# Patient Record
Sex: Female | Born: 1935 | Race: White | Hispanic: No | Marital: Single
Health system: Southern US, Community
[De-identification: ages and names within clinical notes are randomized; demographics above are authoritative.]

## PROBLEM LIST (undated history)

## (undated) DIAGNOSIS — F0390 Unspecified dementia without behavioral disturbance: Secondary | ICD-10-CM

---

## 2010-08-10 ENCOUNTER — Encounter: Admission: RE | Admit: 2010-08-10 | Discharge: 2010-08-10 | Payer: Self-pay | Admitting: Internal Medicine

## 2011-11-09 ENCOUNTER — Other Ambulatory Visit: Payer: Self-pay | Admitting: Internal Medicine

## 2011-11-09 DIAGNOSIS — Z1231 Encounter for screening mammogram for malignant neoplasm of breast: Secondary | ICD-10-CM

## 2011-11-18 DIAGNOSIS — M810 Age-related osteoporosis without current pathological fracture: Secondary | ICD-10-CM | POA: Diagnosis not present

## 2011-11-18 DIAGNOSIS — F3289 Other specified depressive episodes: Secondary | ICD-10-CM | POA: Diagnosis not present

## 2011-11-18 DIAGNOSIS — I10 Essential (primary) hypertension: Secondary | ICD-10-CM | POA: Diagnosis not present

## 2011-11-18 DIAGNOSIS — N182 Chronic kidney disease, stage 2 (mild): Secondary | ICD-10-CM | POA: Diagnosis not present

## 2011-11-18 DIAGNOSIS — H6120 Impacted cerumen, unspecified ear: Secondary | ICD-10-CM | POA: Diagnosis not present

## 2011-11-18 DIAGNOSIS — Z0000 Encounter for general adult medical examination without abnormal findings: Secondary | ICD-10-CM | POA: Diagnosis not present

## 2011-11-18 DIAGNOSIS — F329 Major depressive disorder, single episode, unspecified: Secondary | ICD-10-CM | POA: Diagnosis not present

## 2011-11-18 DIAGNOSIS — F411 Generalized anxiety disorder: Secondary | ICD-10-CM | POA: Diagnosis not present

## 2011-11-29 DIAGNOSIS — H9190 Unspecified hearing loss, unspecified ear: Secondary | ICD-10-CM | POA: Diagnosis not present

## 2011-11-29 DIAGNOSIS — E785 Hyperlipidemia, unspecified: Secondary | ICD-10-CM | POA: Diagnosis not present

## 2011-11-29 DIAGNOSIS — E559 Vitamin D deficiency, unspecified: Secondary | ICD-10-CM | POA: Diagnosis not present

## 2011-11-29 DIAGNOSIS — I10 Essential (primary) hypertension: Secondary | ICD-10-CM | POA: Diagnosis not present

## 2011-11-29 DIAGNOSIS — F329 Major depressive disorder, single episode, unspecified: Secondary | ICD-10-CM | POA: Diagnosis not present

## 2011-11-29 DIAGNOSIS — R413 Other amnesia: Secondary | ICD-10-CM | POA: Diagnosis not present

## 2011-11-30 ENCOUNTER — Ambulatory Visit: Payer: Self-pay

## 2011-12-08 DIAGNOSIS — H903 Sensorineural hearing loss, bilateral: Secondary | ICD-10-CM | POA: Diagnosis not present

## 2012-02-28 DIAGNOSIS — H9190 Unspecified hearing loss, unspecified ear: Secondary | ICD-10-CM | POA: Diagnosis not present

## 2012-02-28 DIAGNOSIS — E559 Vitamin D deficiency, unspecified: Secondary | ICD-10-CM | POA: Diagnosis not present

## 2012-02-28 DIAGNOSIS — I10 Essential (primary) hypertension: Secondary | ICD-10-CM | POA: Diagnosis not present

## 2012-02-28 DIAGNOSIS — E785 Hyperlipidemia, unspecified: Secondary | ICD-10-CM | POA: Diagnosis not present

## 2012-02-28 DIAGNOSIS — R413 Other amnesia: Secondary | ICD-10-CM | POA: Diagnosis not present

## 2012-02-28 DIAGNOSIS — M810 Age-related osteoporosis without current pathological fracture: Secondary | ICD-10-CM | POA: Diagnosis not present

## 2012-02-28 DIAGNOSIS — Z1331 Encounter for screening for depression: Secondary | ICD-10-CM | POA: Diagnosis not present

## 2012-05-01 DIAGNOSIS — J209 Acute bronchitis, unspecified: Secondary | ICD-10-CM | POA: Diagnosis not present

## 2012-07-26 DIAGNOSIS — I10 Essential (primary) hypertension: Secondary | ICD-10-CM | POA: Diagnosis not present

## 2012-07-26 DIAGNOSIS — N184 Chronic kidney disease, stage 4 (severe): Secondary | ICD-10-CM | POA: Diagnosis not present

## 2012-07-26 DIAGNOSIS — K219 Gastro-esophageal reflux disease without esophagitis: Secondary | ICD-10-CM | POA: Diagnosis not present

## 2012-07-26 DIAGNOSIS — D649 Anemia, unspecified: Secondary | ICD-10-CM | POA: Diagnosis not present

## 2012-07-26 DIAGNOSIS — F329 Major depressive disorder, single episode, unspecified: Secondary | ICD-10-CM | POA: Diagnosis not present

## 2012-07-26 DIAGNOSIS — E785 Hyperlipidemia, unspecified: Secondary | ICD-10-CM | POA: Diagnosis not present

## 2012-07-26 DIAGNOSIS — R238 Other skin changes: Secondary | ICD-10-CM | POA: Diagnosis not present

## 2012-08-28 DIAGNOSIS — Z124 Encounter for screening for malignant neoplasm of cervix: Secondary | ICD-10-CM | POA: Insufficient documentation

## 2012-08-28 DIAGNOSIS — Z1151 Encounter for screening for human papillomavirus (HPV): Secondary | ICD-10-CM | POA: Insufficient documentation

## 2012-08-29 DIAGNOSIS — Z23 Encounter for immunization: Secondary | ICD-10-CM | POA: Diagnosis not present

## 2012-08-29 DIAGNOSIS — I10 Essential (primary) hypertension: Secondary | ICD-10-CM | POA: Diagnosis not present

## 2012-08-29 DIAGNOSIS — Z0000 Encounter for general adult medical examination without abnormal findings: Secondary | ICD-10-CM | POA: Diagnosis not present

## 2012-08-29 DIAGNOSIS — Z1331 Encounter for screening for depression: Secondary | ICD-10-CM | POA: Diagnosis not present

## 2012-08-29 DIAGNOSIS — N898 Other specified noninflammatory disorders of vagina: Secondary | ICD-10-CM | POA: Diagnosis not present

## 2012-08-29 DIAGNOSIS — F0280 Dementia in other diseases classified elsewhere without behavioral disturbance: Secondary | ICD-10-CM | POA: Diagnosis not present

## 2012-08-29 DIAGNOSIS — E785 Hyperlipidemia, unspecified: Secondary | ICD-10-CM | POA: Diagnosis not present

## 2012-08-29 DIAGNOSIS — M810 Age-related osteoporosis without current pathological fracture: Secondary | ICD-10-CM | POA: Diagnosis not present

## 2012-08-29 DIAGNOSIS — F329 Major depressive disorder, single episode, unspecified: Secondary | ICD-10-CM | POA: Diagnosis not present

## 2012-09-05 ENCOUNTER — Other Ambulatory Visit (HOSPITAL_COMMUNITY)
Admission: RE | Admit: 2012-09-05 | Discharge: 2012-09-05 | Disposition: A | Discharge status: Home / Self Care | Payer: Medicare Other | Source: Ambulatory Visit | Attending: Internal Medicine | Admitting: Internal Medicine

## 2012-11-23 DIAGNOSIS — E785 Hyperlipidemia, unspecified: Secondary | ICD-10-CM | POA: Diagnosis not present

## 2012-11-23 DIAGNOSIS — F329 Major depressive disorder, single episode, unspecified: Secondary | ICD-10-CM | POA: Diagnosis not present

## 2012-11-23 DIAGNOSIS — G309 Alzheimer's disease, unspecified: Secondary | ICD-10-CM | POA: Diagnosis not present

## 2013-01-24 DIAGNOSIS — F05 Delirium due to known physiological condition: Secondary | ICD-10-CM | POA: Diagnosis not present

## 2013-01-24 DIAGNOSIS — R82998 Other abnormal findings in urine: Secondary | ICD-10-CM | POA: Diagnosis not present

## 2013-02-20 DIAGNOSIS — I10 Essential (primary) hypertension: Secondary | ICD-10-CM | POA: Diagnosis not present

## 2013-02-20 DIAGNOSIS — F0280 Dementia in other diseases classified elsewhere without behavioral disturbance: Secondary | ICD-10-CM | POA: Diagnosis not present

## 2013-02-20 DIAGNOSIS — G309 Alzheimer's disease, unspecified: Secondary | ICD-10-CM | POA: Diagnosis not present

## 2013-02-20 DIAGNOSIS — F329 Major depressive disorder, single episode, unspecified: Secondary | ICD-10-CM | POA: Diagnosis not present

## 2013-03-18 DIAGNOSIS — Z79899 Other long term (current) drug therapy: Secondary | ICD-10-CM | POA: Diagnosis not present

## 2013-03-18 DIAGNOSIS — G309 Alzheimer's disease, unspecified: Secondary | ICD-10-CM | POA: Diagnosis not present

## 2013-03-18 DIAGNOSIS — E785 Hyperlipidemia, unspecified: Secondary | ICD-10-CM | POA: Diagnosis not present

## 2013-03-18 DIAGNOSIS — R634 Abnormal weight loss: Secondary | ICD-10-CM | POA: Diagnosis not present

## 2013-03-18 DIAGNOSIS — I10 Essential (primary) hypertension: Secondary | ICD-10-CM | POA: Diagnosis not present

## 2013-03-18 DIAGNOSIS — F0280 Dementia in other diseases classified elsewhere without behavioral disturbance: Secondary | ICD-10-CM | POA: Diagnosis not present

## 2013-04-03 DIAGNOSIS — I10 Essential (primary) hypertension: Secondary | ICD-10-CM | POA: Diagnosis not present

## 2013-04-03 DIAGNOSIS — F0280 Dementia in other diseases classified elsewhere without behavioral disturbance: Secondary | ICD-10-CM | POA: Diagnosis not present

## 2013-04-03 DIAGNOSIS — L989 Disorder of the skin and subcutaneous tissue, unspecified: Secondary | ICD-10-CM | POA: Diagnosis not present

## 2013-04-22 DIAGNOSIS — G309 Alzheimer's disease, unspecified: Secondary | ICD-10-CM | POA: Diagnosis not present

## 2013-04-22 DIAGNOSIS — M25559 Pain in unspecified hip: Secondary | ICD-10-CM | POA: Diagnosis not present

## 2013-04-22 DIAGNOSIS — G479 Sleep disorder, unspecified: Secondary | ICD-10-CM | POA: Diagnosis not present

## 2013-04-22 DIAGNOSIS — F0280 Dementia in other diseases classified elsewhere without behavioral disturbance: Secondary | ICD-10-CM | POA: Diagnosis not present

## 2013-04-22 DIAGNOSIS — I10 Essential (primary) hypertension: Secondary | ICD-10-CM | POA: Diagnosis not present

## 2013-06-11 DIAGNOSIS — Z111 Encounter for screening for respiratory tuberculosis: Secondary | ICD-10-CM | POA: Diagnosis not present

## 2013-07-10 DIAGNOSIS — F0280 Dementia in other diseases classified elsewhere without behavioral disturbance: Secondary | ICD-10-CM | POA: Diagnosis not present

## 2013-07-10 DIAGNOSIS — R635 Abnormal weight gain: Secondary | ICD-10-CM | POA: Diagnosis not present

## 2013-07-10 DIAGNOSIS — I10 Essential (primary) hypertension: Secondary | ICD-10-CM | POA: Diagnosis not present

## 2013-07-10 DIAGNOSIS — E785 Hyperlipidemia, unspecified: Secondary | ICD-10-CM | POA: Diagnosis not present

## 2013-07-30 DIAGNOSIS — I10 Essential (primary) hypertension: Secondary | ICD-10-CM | POA: Diagnosis not present

## 2013-07-30 DIAGNOSIS — F0280 Dementia in other diseases classified elsewhere without behavioral disturbance: Secondary | ICD-10-CM | POA: Diagnosis not present

## 2013-08-21 DIAGNOSIS — Z23 Encounter for immunization: Secondary | ICD-10-CM | POA: Diagnosis not present

## 2013-09-02 DIAGNOSIS — M25559 Pain in unspecified hip: Secondary | ICD-10-CM | POA: Diagnosis not present

## 2013-09-06 DIAGNOSIS — I10 Essential (primary) hypertension: Secondary | ICD-10-CM | POA: Diagnosis not present

## 2013-09-06 DIAGNOSIS — Z79899 Other long term (current) drug therapy: Secondary | ICD-10-CM | POA: Diagnosis not present

## 2013-09-06 DIAGNOSIS — E785 Hyperlipidemia, unspecified: Secondary | ICD-10-CM | POA: Diagnosis not present

## 2013-09-06 DIAGNOSIS — Z0000 Encounter for general adult medical examination without abnormal findings: Secondary | ICD-10-CM | POA: Diagnosis not present

## 2013-09-06 DIAGNOSIS — Z1331 Encounter for screening for depression: Secondary | ICD-10-CM | POA: Diagnosis not present

## 2013-11-08 DIAGNOSIS — M6281 Muscle weakness (generalized): Secondary | ICD-10-CM | POA: Diagnosis not present

## 2013-11-08 DIAGNOSIS — R279 Unspecified lack of coordination: Secondary | ICD-10-CM | POA: Diagnosis not present

## 2013-11-08 DIAGNOSIS — R269 Unspecified abnormalities of gait and mobility: Secondary | ICD-10-CM | POA: Diagnosis not present

## 2013-11-11 DIAGNOSIS — M6281 Muscle weakness (generalized): Secondary | ICD-10-CM | POA: Diagnosis not present

## 2013-11-11 DIAGNOSIS — R269 Unspecified abnormalities of gait and mobility: Secondary | ICD-10-CM | POA: Diagnosis not present

## 2013-11-11 DIAGNOSIS — R279 Unspecified lack of coordination: Secondary | ICD-10-CM | POA: Diagnosis not present

## 2013-11-12 DIAGNOSIS — R279 Unspecified lack of coordination: Secondary | ICD-10-CM | POA: Diagnosis not present

## 2013-11-12 DIAGNOSIS — M6281 Muscle weakness (generalized): Secondary | ICD-10-CM | POA: Diagnosis not present

## 2013-11-12 DIAGNOSIS — R269 Unspecified abnormalities of gait and mobility: Secondary | ICD-10-CM | POA: Diagnosis not present

## 2013-11-18 DIAGNOSIS — R488 Other symbolic dysfunctions: Secondary | ICD-10-CM | POA: Diagnosis not present

## 2013-11-18 DIAGNOSIS — M6281 Muscle weakness (generalized): Secondary | ICD-10-CM | POA: Diagnosis not present

## 2013-11-18 DIAGNOSIS — R269 Unspecified abnormalities of gait and mobility: Secondary | ICD-10-CM | POA: Diagnosis not present

## 2013-11-18 DIAGNOSIS — R41841 Cognitive communication deficit: Secondary | ICD-10-CM | POA: Diagnosis not present

## 2013-11-18 DIAGNOSIS — R279 Unspecified lack of coordination: Secondary | ICD-10-CM | POA: Diagnosis not present

## 2013-11-19 DIAGNOSIS — R269 Unspecified abnormalities of gait and mobility: Secondary | ICD-10-CM | POA: Diagnosis not present

## 2013-11-19 DIAGNOSIS — R41841 Cognitive communication deficit: Secondary | ICD-10-CM | POA: Diagnosis not present

## 2013-11-19 DIAGNOSIS — R279 Unspecified lack of coordination: Secondary | ICD-10-CM | POA: Diagnosis not present

## 2013-11-19 DIAGNOSIS — M6281 Muscle weakness (generalized): Secondary | ICD-10-CM | POA: Diagnosis not present

## 2013-11-19 DIAGNOSIS — R488 Other symbolic dysfunctions: Secondary | ICD-10-CM | POA: Diagnosis not present

## 2013-11-20 DIAGNOSIS — R41841 Cognitive communication deficit: Secondary | ICD-10-CM | POA: Diagnosis not present

## 2013-11-20 DIAGNOSIS — M6281 Muscle weakness (generalized): Secondary | ICD-10-CM | POA: Diagnosis not present

## 2013-11-20 DIAGNOSIS — R269 Unspecified abnormalities of gait and mobility: Secondary | ICD-10-CM | POA: Diagnosis not present

## 2013-11-20 DIAGNOSIS — R279 Unspecified lack of coordination: Secondary | ICD-10-CM | POA: Diagnosis not present

## 2013-11-20 DIAGNOSIS — R488 Other symbolic dysfunctions: Secondary | ICD-10-CM | POA: Diagnosis not present

## 2013-11-26 DIAGNOSIS — R488 Other symbolic dysfunctions: Secondary | ICD-10-CM | POA: Diagnosis not present

## 2013-11-26 DIAGNOSIS — R279 Unspecified lack of coordination: Secondary | ICD-10-CM | POA: Diagnosis not present

## 2013-11-26 DIAGNOSIS — R269 Unspecified abnormalities of gait and mobility: Secondary | ICD-10-CM | POA: Diagnosis not present

## 2013-11-26 DIAGNOSIS — M6281 Muscle weakness (generalized): Secondary | ICD-10-CM | POA: Diagnosis not present

## 2013-11-26 DIAGNOSIS — R41841 Cognitive communication deficit: Secondary | ICD-10-CM | POA: Diagnosis not present

## 2013-11-28 DIAGNOSIS — R41841 Cognitive communication deficit: Secondary | ICD-10-CM | POA: Diagnosis not present

## 2013-11-28 DIAGNOSIS — R488 Other symbolic dysfunctions: Secondary | ICD-10-CM | POA: Diagnosis not present

## 2013-11-28 DIAGNOSIS — R279 Unspecified lack of coordination: Secondary | ICD-10-CM | POA: Diagnosis not present

## 2013-11-28 DIAGNOSIS — M6281 Muscle weakness (generalized): Secondary | ICD-10-CM | POA: Diagnosis not present

## 2013-11-28 DIAGNOSIS — R269 Unspecified abnormalities of gait and mobility: Secondary | ICD-10-CM | POA: Diagnosis not present

## 2013-11-29 DIAGNOSIS — M6281 Muscle weakness (generalized): Secondary | ICD-10-CM | POA: Diagnosis not present

## 2013-11-29 DIAGNOSIS — R488 Other symbolic dysfunctions: Secondary | ICD-10-CM | POA: Diagnosis not present

## 2013-11-29 DIAGNOSIS — R41841 Cognitive communication deficit: Secondary | ICD-10-CM | POA: Diagnosis not present

## 2013-11-29 DIAGNOSIS — R269 Unspecified abnormalities of gait and mobility: Secondary | ICD-10-CM | POA: Diagnosis not present

## 2013-11-29 DIAGNOSIS — R279 Unspecified lack of coordination: Secondary | ICD-10-CM | POA: Diagnosis not present

## 2013-12-03 DIAGNOSIS — M6281 Muscle weakness (generalized): Secondary | ICD-10-CM | POA: Diagnosis not present

## 2013-12-03 DIAGNOSIS — R269 Unspecified abnormalities of gait and mobility: Secondary | ICD-10-CM | POA: Diagnosis not present

## 2013-12-03 DIAGNOSIS — R488 Other symbolic dysfunctions: Secondary | ICD-10-CM | POA: Diagnosis not present

## 2013-12-03 DIAGNOSIS — R41841 Cognitive communication deficit: Secondary | ICD-10-CM | POA: Diagnosis not present

## 2013-12-03 DIAGNOSIS — R279 Unspecified lack of coordination: Secondary | ICD-10-CM | POA: Diagnosis not present

## 2013-12-04 DIAGNOSIS — R269 Unspecified abnormalities of gait and mobility: Secondary | ICD-10-CM | POA: Diagnosis not present

## 2013-12-04 DIAGNOSIS — R41841 Cognitive communication deficit: Secondary | ICD-10-CM | POA: Diagnosis not present

## 2013-12-04 DIAGNOSIS — M6281 Muscle weakness (generalized): Secondary | ICD-10-CM | POA: Diagnosis not present

## 2013-12-04 DIAGNOSIS — R279 Unspecified lack of coordination: Secondary | ICD-10-CM | POA: Diagnosis not present

## 2013-12-04 DIAGNOSIS — R488 Other symbolic dysfunctions: Secondary | ICD-10-CM | POA: Diagnosis not present

## 2013-12-05 DIAGNOSIS — R279 Unspecified lack of coordination: Secondary | ICD-10-CM | POA: Diagnosis not present

## 2013-12-05 DIAGNOSIS — R269 Unspecified abnormalities of gait and mobility: Secondary | ICD-10-CM | POA: Diagnosis not present

## 2013-12-05 DIAGNOSIS — M6281 Muscle weakness (generalized): Secondary | ICD-10-CM | POA: Diagnosis not present

## 2013-12-05 DIAGNOSIS — R41841 Cognitive communication deficit: Secondary | ICD-10-CM | POA: Diagnosis not present

## 2013-12-05 DIAGNOSIS — R488 Other symbolic dysfunctions: Secondary | ICD-10-CM | POA: Diagnosis not present

## 2013-12-06 DIAGNOSIS — R488 Other symbolic dysfunctions: Secondary | ICD-10-CM | POA: Diagnosis not present

## 2013-12-06 DIAGNOSIS — M6281 Muscle weakness (generalized): Secondary | ICD-10-CM | POA: Diagnosis not present

## 2013-12-06 DIAGNOSIS — R279 Unspecified lack of coordination: Secondary | ICD-10-CM | POA: Diagnosis not present

## 2013-12-06 DIAGNOSIS — R269 Unspecified abnormalities of gait and mobility: Secondary | ICD-10-CM | POA: Diagnosis not present

## 2013-12-06 DIAGNOSIS — R41841 Cognitive communication deficit: Secondary | ICD-10-CM | POA: Diagnosis not present

## 2013-12-09 DIAGNOSIS — R41841 Cognitive communication deficit: Secondary | ICD-10-CM | POA: Diagnosis not present

## 2013-12-09 DIAGNOSIS — R488 Other symbolic dysfunctions: Secondary | ICD-10-CM | POA: Diagnosis not present

## 2013-12-09 DIAGNOSIS — M6281 Muscle weakness (generalized): Secondary | ICD-10-CM | POA: Diagnosis not present

## 2013-12-09 DIAGNOSIS — R279 Unspecified lack of coordination: Secondary | ICD-10-CM | POA: Diagnosis not present

## 2013-12-09 DIAGNOSIS — R269 Unspecified abnormalities of gait and mobility: Secondary | ICD-10-CM | POA: Diagnosis not present

## 2013-12-13 DIAGNOSIS — R269 Unspecified abnormalities of gait and mobility: Secondary | ICD-10-CM | POA: Diagnosis not present

## 2013-12-13 DIAGNOSIS — M6281 Muscle weakness (generalized): Secondary | ICD-10-CM | POA: Diagnosis not present

## 2013-12-13 DIAGNOSIS — R41841 Cognitive communication deficit: Secondary | ICD-10-CM | POA: Diagnosis not present

## 2013-12-13 DIAGNOSIS — R279 Unspecified lack of coordination: Secondary | ICD-10-CM | POA: Diagnosis not present

## 2013-12-13 DIAGNOSIS — R488 Other symbolic dysfunctions: Secondary | ICD-10-CM | POA: Diagnosis not present

## 2013-12-16 DIAGNOSIS — R269 Unspecified abnormalities of gait and mobility: Secondary | ICD-10-CM | POA: Diagnosis not present

## 2013-12-16 DIAGNOSIS — R279 Unspecified lack of coordination: Secondary | ICD-10-CM | POA: Diagnosis not present

## 2013-12-16 DIAGNOSIS — M6281 Muscle weakness (generalized): Secondary | ICD-10-CM | POA: Diagnosis not present

## 2013-12-18 DIAGNOSIS — R279 Unspecified lack of coordination: Secondary | ICD-10-CM | POA: Diagnosis not present

## 2013-12-18 DIAGNOSIS — R269 Unspecified abnormalities of gait and mobility: Secondary | ICD-10-CM | POA: Diagnosis not present

## 2013-12-18 DIAGNOSIS — M6281 Muscle weakness (generalized): Secondary | ICD-10-CM | POA: Diagnosis not present

## 2013-12-19 DIAGNOSIS — R279 Unspecified lack of coordination: Secondary | ICD-10-CM | POA: Diagnosis not present

## 2013-12-19 DIAGNOSIS — R269 Unspecified abnormalities of gait and mobility: Secondary | ICD-10-CM | POA: Diagnosis not present

## 2013-12-19 DIAGNOSIS — M6281 Muscle weakness (generalized): Secondary | ICD-10-CM | POA: Diagnosis not present

## 2013-12-24 DIAGNOSIS — M6281 Muscle weakness (generalized): Secondary | ICD-10-CM | POA: Diagnosis not present

## 2013-12-24 DIAGNOSIS — R279 Unspecified lack of coordination: Secondary | ICD-10-CM | POA: Diagnosis not present

## 2013-12-24 DIAGNOSIS — R269 Unspecified abnormalities of gait and mobility: Secondary | ICD-10-CM | POA: Diagnosis not present

## 2013-12-26 DIAGNOSIS — R269 Unspecified abnormalities of gait and mobility: Secondary | ICD-10-CM | POA: Diagnosis not present

## 2013-12-26 DIAGNOSIS — M6281 Muscle weakness (generalized): Secondary | ICD-10-CM | POA: Diagnosis not present

## 2013-12-26 DIAGNOSIS — R279 Unspecified lack of coordination: Secondary | ICD-10-CM | POA: Diagnosis not present

## 2013-12-30 DIAGNOSIS — M6281 Muscle weakness (generalized): Secondary | ICD-10-CM | POA: Diagnosis not present

## 2013-12-30 DIAGNOSIS — R269 Unspecified abnormalities of gait and mobility: Secondary | ICD-10-CM | POA: Diagnosis not present

## 2013-12-30 DIAGNOSIS — R279 Unspecified lack of coordination: Secondary | ICD-10-CM | POA: Diagnosis not present

## 2014-01-01 DIAGNOSIS — R269 Unspecified abnormalities of gait and mobility: Secondary | ICD-10-CM | POA: Diagnosis not present

## 2014-01-01 DIAGNOSIS — R279 Unspecified lack of coordination: Secondary | ICD-10-CM | POA: Diagnosis not present

## 2014-01-01 DIAGNOSIS — M6281 Muscle weakness (generalized): Secondary | ICD-10-CM | POA: Diagnosis not present

## 2014-01-06 DIAGNOSIS — M6281 Muscle weakness (generalized): Secondary | ICD-10-CM | POA: Diagnosis not present

## 2014-01-06 DIAGNOSIS — R269 Unspecified abnormalities of gait and mobility: Secondary | ICD-10-CM | POA: Diagnosis not present

## 2014-01-06 DIAGNOSIS — R279 Unspecified lack of coordination: Secondary | ICD-10-CM | POA: Diagnosis not present

## 2014-04-22 DIAGNOSIS — G309 Alzheimer's disease, unspecified: Secondary | ICD-10-CM | POA: Diagnosis not present

## 2014-04-22 DIAGNOSIS — E559 Vitamin D deficiency, unspecified: Secondary | ICD-10-CM | POA: Diagnosis not present

## 2014-04-22 DIAGNOSIS — I10 Essential (primary) hypertension: Secondary | ICD-10-CM | POA: Diagnosis not present

## 2014-04-22 DIAGNOSIS — F0280 Dementia in other diseases classified elsewhere without behavioral disturbance: Secondary | ICD-10-CM | POA: Diagnosis not present

## 2014-04-22 DIAGNOSIS — J309 Allergic rhinitis, unspecified: Secondary | ICD-10-CM | POA: Diagnosis not present

## 2014-04-22 DIAGNOSIS — F411 Generalized anxiety disorder: Secondary | ICD-10-CM | POA: Diagnosis not present

## 2014-04-22 DIAGNOSIS — M549 Dorsalgia, unspecified: Secondary | ICD-10-CM | POA: Diagnosis not present

## 2014-10-14 DIAGNOSIS — E780 Pure hypercholesterolemia: Secondary | ICD-10-CM | POA: Diagnosis not present

## 2014-10-14 DIAGNOSIS — Z0000 Encounter for general adult medical examination without abnormal findings: Secondary | ICD-10-CM | POA: Diagnosis not present

## 2014-10-14 DIAGNOSIS — F325 Major depressive disorder, single episode, in full remission: Secondary | ICD-10-CM | POA: Diagnosis not present

## 2014-10-14 DIAGNOSIS — Z23 Encounter for immunization: Secondary | ICD-10-CM | POA: Diagnosis not present

## 2014-10-14 DIAGNOSIS — M810 Age-related osteoporosis without current pathological fracture: Secondary | ICD-10-CM | POA: Diagnosis not present

## 2014-10-14 DIAGNOSIS — G301 Alzheimer's disease with late onset: Secondary | ICD-10-CM | POA: Diagnosis not present

## 2014-10-14 DIAGNOSIS — Z79899 Other long term (current) drug therapy: Secondary | ICD-10-CM | POA: Diagnosis not present

## 2015-01-26 DIAGNOSIS — L84 Corns and callosities: Secondary | ICD-10-CM | POA: Diagnosis not present

## 2015-02-11 ENCOUNTER — Ambulatory Visit: Payer: Medicare Other | Admitting: Podiatry

## 2015-02-20 ENCOUNTER — Ambulatory Visit: Payer: Medicare Other | Admitting: Podiatry

## 2015-04-20 DIAGNOSIS — G301 Alzheimer's disease with late onset: Secondary | ICD-10-CM | POA: Diagnosis not present

## 2015-04-20 DIAGNOSIS — E780 Pure hypercholesterolemia: Secondary | ICD-10-CM | POA: Diagnosis not present

## 2015-04-20 DIAGNOSIS — N183 Chronic kidney disease, stage 3 (moderate): Secondary | ICD-10-CM | POA: Diagnosis not present

## 2015-04-20 DIAGNOSIS — Z79899 Other long term (current) drug therapy: Secondary | ICD-10-CM | POA: Diagnosis not present

## 2015-04-20 DIAGNOSIS — I129 Hypertensive chronic kidney disease with stage 1 through stage 4 chronic kidney disease, or unspecified chronic kidney disease: Secondary | ICD-10-CM | POA: Diagnosis not present

## 2015-09-16 DIAGNOSIS — Z23 Encounter for immunization: Secondary | ICD-10-CM | POA: Diagnosis not present

## 2015-10-16 DIAGNOSIS — Z1389 Encounter for screening for other disorder: Secondary | ICD-10-CM | POA: Diagnosis not present

## 2015-10-16 DIAGNOSIS — I129 Hypertensive chronic kidney disease with stage 1 through stage 4 chronic kidney disease, or unspecified chronic kidney disease: Secondary | ICD-10-CM | POA: Diagnosis not present

## 2015-10-16 DIAGNOSIS — Z79899 Other long term (current) drug therapy: Secondary | ICD-10-CM | POA: Diagnosis not present

## 2015-10-16 DIAGNOSIS — Z6830 Body mass index (BMI) 30.0-30.9, adult: Secondary | ICD-10-CM | POA: Diagnosis not present

## 2015-10-16 DIAGNOSIS — E669 Obesity, unspecified: Secondary | ICD-10-CM | POA: Diagnosis not present

## 2015-10-16 DIAGNOSIS — N183 Chronic kidney disease, stage 3 (moderate): Secondary | ICD-10-CM | POA: Diagnosis not present

## 2015-10-16 DIAGNOSIS — I10 Essential (primary) hypertension: Secondary | ICD-10-CM | POA: Diagnosis not present

## 2015-10-16 DIAGNOSIS — G301 Alzheimer's disease with late onset: Secondary | ICD-10-CM | POA: Diagnosis not present

## 2015-10-16 DIAGNOSIS — E7800 Pure hypercholesterolemia, unspecified: Secondary | ICD-10-CM | POA: Diagnosis not present

## 2016-01-21 DIAGNOSIS — G301 Alzheimer's disease with late onset: Secondary | ICD-10-CM | POA: Diagnosis not present

## 2016-01-21 DIAGNOSIS — N390 Urinary tract infection, site not specified: Secondary | ICD-10-CM | POA: Diagnosis not present

## 2016-01-22 DIAGNOSIS — N390 Urinary tract infection, site not specified: Secondary | ICD-10-CM | POA: Diagnosis not present

## 2016-02-01 DIAGNOSIS — R488 Other symbolic dysfunctions: Secondary | ICD-10-CM | POA: Diagnosis not present

## 2016-02-02 DIAGNOSIS — R488 Other symbolic dysfunctions: Secondary | ICD-10-CM | POA: Diagnosis not present

## 2016-02-03 DIAGNOSIS — R488 Other symbolic dysfunctions: Secondary | ICD-10-CM | POA: Diagnosis not present

## 2016-02-04 DIAGNOSIS — R488 Other symbolic dysfunctions: Secondary | ICD-10-CM | POA: Diagnosis not present

## 2016-02-05 DIAGNOSIS — R488 Other symbolic dysfunctions: Secondary | ICD-10-CM | POA: Diagnosis not present

## 2016-02-08 DIAGNOSIS — R488 Other symbolic dysfunctions: Secondary | ICD-10-CM | POA: Diagnosis not present

## 2016-02-09 DIAGNOSIS — R488 Other symbolic dysfunctions: Secondary | ICD-10-CM | POA: Diagnosis not present

## 2016-02-10 DIAGNOSIS — R488 Other symbolic dysfunctions: Secondary | ICD-10-CM | POA: Diagnosis not present

## 2016-02-16 DIAGNOSIS — R488 Other symbolic dysfunctions: Secondary | ICD-10-CM | POA: Diagnosis not present

## 2016-02-17 DIAGNOSIS — R488 Other symbolic dysfunctions: Secondary | ICD-10-CM | POA: Diagnosis not present

## 2016-02-19 DIAGNOSIS — R488 Other symbolic dysfunctions: Secondary | ICD-10-CM | POA: Diagnosis not present

## 2016-02-23 DIAGNOSIS — R488 Other symbolic dysfunctions: Secondary | ICD-10-CM | POA: Diagnosis not present

## 2016-02-24 DIAGNOSIS — R488 Other symbolic dysfunctions: Secondary | ICD-10-CM | POA: Diagnosis not present

## 2016-02-25 DIAGNOSIS — R488 Other symbolic dysfunctions: Secondary | ICD-10-CM | POA: Diagnosis not present

## 2016-03-01 DIAGNOSIS — R488 Other symbolic dysfunctions: Secondary | ICD-10-CM | POA: Diagnosis not present

## 2016-03-03 DIAGNOSIS — R488 Other symbolic dysfunctions: Secondary | ICD-10-CM | POA: Diagnosis not present

## 2016-03-04 DIAGNOSIS — R488 Other symbolic dysfunctions: Secondary | ICD-10-CM | POA: Diagnosis not present

## 2016-03-07 DIAGNOSIS — R488 Other symbolic dysfunctions: Secondary | ICD-10-CM | POA: Diagnosis not present

## 2016-03-10 DIAGNOSIS — R488 Other symbolic dysfunctions: Secondary | ICD-10-CM | POA: Diagnosis not present

## 2016-03-11 DIAGNOSIS — R488 Other symbolic dysfunctions: Secondary | ICD-10-CM | POA: Diagnosis not present

## 2016-03-15 DIAGNOSIS — R488 Other symbolic dysfunctions: Secondary | ICD-10-CM | POA: Diagnosis not present

## 2016-03-16 DIAGNOSIS — R488 Other symbolic dysfunctions: Secondary | ICD-10-CM | POA: Diagnosis not present

## 2016-03-17 DIAGNOSIS — R488 Other symbolic dysfunctions: Secondary | ICD-10-CM | POA: Diagnosis not present

## 2016-03-22 DIAGNOSIS — R488 Other symbolic dysfunctions: Secondary | ICD-10-CM | POA: Diagnosis not present

## 2016-03-23 DIAGNOSIS — R488 Other symbolic dysfunctions: Secondary | ICD-10-CM | POA: Diagnosis not present

## 2016-03-31 DIAGNOSIS — R488 Other symbolic dysfunctions: Secondary | ICD-10-CM | POA: Diagnosis not present

## 2016-04-19 DIAGNOSIS — I129 Hypertensive chronic kidney disease with stage 1 through stage 4 chronic kidney disease, or unspecified chronic kidney disease: Secondary | ICD-10-CM | POA: Diagnosis not present

## 2016-04-19 DIAGNOSIS — N183 Chronic kidney disease, stage 3 (moderate): Secondary | ICD-10-CM | POA: Diagnosis not present

## 2016-04-19 DIAGNOSIS — G301 Alzheimer's disease with late onset: Secondary | ICD-10-CM | POA: Diagnosis not present

## 2016-04-19 DIAGNOSIS — F325 Major depressive disorder, single episode, in full remission: Secondary | ICD-10-CM | POA: Diagnosis not present

## 2016-04-19 DIAGNOSIS — R3981 Functional urinary incontinence: Secondary | ICD-10-CM | POA: Diagnosis not present

## 2016-06-27 DIAGNOSIS — M1611 Unilateral primary osteoarthritis, right hip: Secondary | ICD-10-CM | POA: Diagnosis not present

## 2016-06-27 DIAGNOSIS — M1711 Unilateral primary osteoarthritis, right knee: Secondary | ICD-10-CM | POA: Diagnosis not present

## 2016-06-28 DIAGNOSIS — M25551 Pain in right hip: Secondary | ICD-10-CM | POA: Diagnosis not present

## 2016-06-28 DIAGNOSIS — M25552 Pain in left hip: Secondary | ICD-10-CM | POA: Diagnosis not present

## 2016-06-28 DIAGNOSIS — R262 Difficulty in walking, not elsewhere classified: Secondary | ICD-10-CM | POA: Diagnosis not present

## 2016-06-28 DIAGNOSIS — M25562 Pain in left knee: Secondary | ICD-10-CM | POA: Diagnosis not present

## 2016-06-30 DIAGNOSIS — M25551 Pain in right hip: Secondary | ICD-10-CM | POA: Diagnosis not present

## 2016-06-30 DIAGNOSIS — R262 Difficulty in walking, not elsewhere classified: Secondary | ICD-10-CM | POA: Diagnosis not present

## 2016-06-30 DIAGNOSIS — M25552 Pain in left hip: Secondary | ICD-10-CM | POA: Diagnosis not present

## 2016-07-01 DIAGNOSIS — M25551 Pain in right hip: Secondary | ICD-10-CM | POA: Diagnosis not present

## 2016-07-01 DIAGNOSIS — R262 Difficulty in walking, not elsewhere classified: Secondary | ICD-10-CM | POA: Diagnosis not present

## 2016-07-01 DIAGNOSIS — M25552 Pain in left hip: Secondary | ICD-10-CM | POA: Diagnosis not present

## 2016-07-04 DIAGNOSIS — M25551 Pain in right hip: Secondary | ICD-10-CM | POA: Diagnosis not present

## 2016-07-04 DIAGNOSIS — R262 Difficulty in walking, not elsewhere classified: Secondary | ICD-10-CM | POA: Diagnosis not present

## 2016-07-04 DIAGNOSIS — M25552 Pain in left hip: Secondary | ICD-10-CM | POA: Diagnosis not present

## 2016-07-05 DIAGNOSIS — M25552 Pain in left hip: Secondary | ICD-10-CM | POA: Diagnosis not present

## 2016-07-05 DIAGNOSIS — R262 Difficulty in walking, not elsewhere classified: Secondary | ICD-10-CM | POA: Diagnosis not present

## 2016-07-05 DIAGNOSIS — M25551 Pain in right hip: Secondary | ICD-10-CM | POA: Diagnosis not present

## 2016-07-06 DIAGNOSIS — M25551 Pain in right hip: Secondary | ICD-10-CM | POA: Diagnosis not present

## 2016-07-06 DIAGNOSIS — R262 Difficulty in walking, not elsewhere classified: Secondary | ICD-10-CM | POA: Diagnosis not present

## 2016-07-06 DIAGNOSIS — M25552 Pain in left hip: Secondary | ICD-10-CM | POA: Diagnosis not present

## 2016-07-08 DIAGNOSIS — M25552 Pain in left hip: Secondary | ICD-10-CM | POA: Diagnosis not present

## 2016-07-08 DIAGNOSIS — R262 Difficulty in walking, not elsewhere classified: Secondary | ICD-10-CM | POA: Diagnosis not present

## 2016-07-08 DIAGNOSIS — M25551 Pain in right hip: Secondary | ICD-10-CM | POA: Diagnosis not present

## 2016-07-11 DIAGNOSIS — M25552 Pain in left hip: Secondary | ICD-10-CM | POA: Diagnosis not present

## 2016-07-11 DIAGNOSIS — M25551 Pain in right hip: Secondary | ICD-10-CM | POA: Diagnosis not present

## 2016-07-11 DIAGNOSIS — R262 Difficulty in walking, not elsewhere classified: Secondary | ICD-10-CM | POA: Diagnosis not present

## 2016-07-14 DIAGNOSIS — R262 Difficulty in walking, not elsewhere classified: Secondary | ICD-10-CM | POA: Diagnosis not present

## 2016-07-14 DIAGNOSIS — M25552 Pain in left hip: Secondary | ICD-10-CM | POA: Diagnosis not present

## 2016-07-14 DIAGNOSIS — M25551 Pain in right hip: Secondary | ICD-10-CM | POA: Diagnosis not present

## 2016-07-19 DIAGNOSIS — M25551 Pain in right hip: Secondary | ICD-10-CM | POA: Diagnosis not present

## 2016-07-19 DIAGNOSIS — M25552 Pain in left hip: Secondary | ICD-10-CM | POA: Diagnosis not present

## 2016-07-19 DIAGNOSIS — R262 Difficulty in walking, not elsewhere classified: Secondary | ICD-10-CM | POA: Diagnosis not present

## 2016-07-21 DIAGNOSIS — R262 Difficulty in walking, not elsewhere classified: Secondary | ICD-10-CM | POA: Diagnosis not present

## 2016-07-21 DIAGNOSIS — M25552 Pain in left hip: Secondary | ICD-10-CM | POA: Diagnosis not present

## 2016-07-21 DIAGNOSIS — M25551 Pain in right hip: Secondary | ICD-10-CM | POA: Diagnosis not present

## 2016-07-26 DIAGNOSIS — M25552 Pain in left hip: Secondary | ICD-10-CM | POA: Diagnosis not present

## 2016-07-26 DIAGNOSIS — R262 Difficulty in walking, not elsewhere classified: Secondary | ICD-10-CM | POA: Diagnosis not present

## 2016-07-26 DIAGNOSIS — M25551 Pain in right hip: Secondary | ICD-10-CM | POA: Diagnosis not present

## 2016-07-28 DIAGNOSIS — M25552 Pain in left hip: Secondary | ICD-10-CM | POA: Diagnosis not present

## 2016-07-28 DIAGNOSIS — R262 Difficulty in walking, not elsewhere classified: Secondary | ICD-10-CM | POA: Diagnosis not present

## 2016-07-28 DIAGNOSIS — M25551 Pain in right hip: Secondary | ICD-10-CM | POA: Diagnosis not present

## 2016-08-02 DIAGNOSIS — R262 Difficulty in walking, not elsewhere classified: Secondary | ICD-10-CM | POA: Diagnosis not present

## 2016-08-02 DIAGNOSIS — M25551 Pain in right hip: Secondary | ICD-10-CM | POA: Diagnosis not present

## 2016-08-02 DIAGNOSIS — M25552 Pain in left hip: Secondary | ICD-10-CM | POA: Diagnosis not present

## 2016-08-04 DIAGNOSIS — M25551 Pain in right hip: Secondary | ICD-10-CM | POA: Diagnosis not present

## 2016-08-04 DIAGNOSIS — M25552 Pain in left hip: Secondary | ICD-10-CM | POA: Diagnosis not present

## 2016-08-04 DIAGNOSIS — R262 Difficulty in walking, not elsewhere classified: Secondary | ICD-10-CM | POA: Diagnosis not present

## 2016-08-09 DIAGNOSIS — R262 Difficulty in walking, not elsewhere classified: Secondary | ICD-10-CM | POA: Diagnosis not present

## 2016-08-09 DIAGNOSIS — M25552 Pain in left hip: Secondary | ICD-10-CM | POA: Diagnosis not present

## 2016-08-09 DIAGNOSIS — M25551 Pain in right hip: Secondary | ICD-10-CM | POA: Diagnosis not present

## 2016-08-11 DIAGNOSIS — M25551 Pain in right hip: Secondary | ICD-10-CM | POA: Diagnosis not present

## 2016-08-11 DIAGNOSIS — M25552 Pain in left hip: Secondary | ICD-10-CM | POA: Diagnosis not present

## 2016-08-11 DIAGNOSIS — R262 Difficulty in walking, not elsewhere classified: Secondary | ICD-10-CM | POA: Diagnosis not present

## 2016-08-16 DIAGNOSIS — M25552 Pain in left hip: Secondary | ICD-10-CM | POA: Diagnosis not present

## 2016-08-16 DIAGNOSIS — R262 Difficulty in walking, not elsewhere classified: Secondary | ICD-10-CM | POA: Diagnosis not present

## 2016-08-16 DIAGNOSIS — M25551 Pain in right hip: Secondary | ICD-10-CM | POA: Diagnosis not present

## 2016-08-18 DIAGNOSIS — M25551 Pain in right hip: Secondary | ICD-10-CM | POA: Diagnosis not present

## 2016-08-18 DIAGNOSIS — R262 Difficulty in walking, not elsewhere classified: Secondary | ICD-10-CM | POA: Diagnosis not present

## 2016-08-18 DIAGNOSIS — M25552 Pain in left hip: Secondary | ICD-10-CM | POA: Diagnosis not present

## 2016-09-14 DIAGNOSIS — N390 Urinary tract infection, site not specified: Secondary | ICD-10-CM | POA: Diagnosis not present

## 2016-10-21 DIAGNOSIS — R3981 Functional urinary incontinence: Secondary | ICD-10-CM | POA: Diagnosis not present

## 2016-10-21 DIAGNOSIS — I10 Essential (primary) hypertension: Secondary | ICD-10-CM | POA: Diagnosis not present

## 2016-10-21 DIAGNOSIS — M810 Age-related osteoporosis without current pathological fracture: Secondary | ICD-10-CM | POA: Diagnosis not present

## 2016-10-21 DIAGNOSIS — Z23 Encounter for immunization: Secondary | ICD-10-CM | POA: Diagnosis not present

## 2016-10-21 DIAGNOSIS — G301 Alzheimer's disease with late onset: Secondary | ICD-10-CM | POA: Diagnosis not present

## 2017-05-03 DIAGNOSIS — I10 Essential (primary) hypertension: Secondary | ICD-10-CM | POA: Diagnosis not present

## 2017-05-03 DIAGNOSIS — J309 Allergic rhinitis, unspecified: Secondary | ICD-10-CM | POA: Diagnosis not present

## 2017-05-03 DIAGNOSIS — F325 Major depressive disorder, single episode, in full remission: Secondary | ICD-10-CM | POA: Diagnosis not present

## 2017-05-03 DIAGNOSIS — G301 Alzheimer's disease with late onset: Secondary | ICD-10-CM | POA: Diagnosis not present

## 2017-05-30 DIAGNOSIS — M2040 Other hammer toe(s) (acquired), unspecified foot: Secondary | ICD-10-CM | POA: Diagnosis not present

## 2017-08-21 DIAGNOSIS — R634 Abnormal weight loss: Secondary | ICD-10-CM | POA: Diagnosis not present

## 2017-08-21 DIAGNOSIS — G301 Alzheimer's disease with late onset: Secondary | ICD-10-CM | POA: Diagnosis not present

## 2017-08-21 DIAGNOSIS — Z79899 Other long term (current) drug therapy: Secondary | ICD-10-CM | POA: Diagnosis not present

## 2017-08-21 DIAGNOSIS — Z23 Encounter for immunization: Secondary | ICD-10-CM | POA: Diagnosis not present

## 2017-08-21 DIAGNOSIS — I10 Essential (primary) hypertension: Secondary | ICD-10-CM | POA: Diagnosis not present

## 2017-09-12 DIAGNOSIS — B351 Tinea unguium: Secondary | ICD-10-CM | POA: Diagnosis not present

## 2017-09-12 DIAGNOSIS — L603 Nail dystrophy: Secondary | ICD-10-CM | POA: Diagnosis not present

## 2017-09-12 DIAGNOSIS — Q845 Enlarged and hypertrophic nails: Secondary | ICD-10-CM | POA: Diagnosis not present

## 2017-09-12 DIAGNOSIS — M2010 Hallux valgus (acquired), unspecified foot: Secondary | ICD-10-CM | POA: Diagnosis not present

## 2017-12-07 DIAGNOSIS — I10 Essential (primary) hypertension: Secondary | ICD-10-CM | POA: Diagnosis not present

## 2017-12-07 DIAGNOSIS — G301 Alzheimer's disease with late onset: Secondary | ICD-10-CM | POA: Diagnosis not present

## 2017-12-07 DIAGNOSIS — F325 Major depressive disorder, single episode, in full remission: Secondary | ICD-10-CM | POA: Diagnosis not present

## 2018-03-26 DIAGNOSIS — I739 Peripheral vascular disease, unspecified: Secondary | ICD-10-CM | POA: Diagnosis not present

## 2018-05-12 ENCOUNTER — Emergency Department (HOSPITAL_COMMUNITY)
Admission: EM | Admit: 2018-05-12 | Discharge: 2018-05-12 | Disposition: A | Discharge status: Home / Self Care | Payer: Medicare Other | Attending: Emergency Medicine | Admitting: Emergency Medicine

## 2018-05-12 ENCOUNTER — Emergency Department (HOSPITAL_COMMUNITY): Payer: Medicare Other

## 2018-05-12 ENCOUNTER — Other Ambulatory Visit: Payer: Self-pay

## 2018-05-12 ENCOUNTER — Encounter (HOSPITAL_COMMUNITY): Payer: Self-pay

## 2018-05-12 DIAGNOSIS — M542 Cervicalgia: Secondary | ICD-10-CM | POA: Insufficient documentation

## 2018-05-12 DIAGNOSIS — W19XXXA Unspecified fall, initial encounter: Secondary | ICD-10-CM | POA: Insufficient documentation

## 2018-05-12 DIAGNOSIS — S0083XA Contusion of other part of head, initial encounter: Secondary | ICD-10-CM

## 2018-05-12 DIAGNOSIS — R52 Pain, unspecified: Secondary | ICD-10-CM | POA: Diagnosis not present

## 2018-05-12 DIAGNOSIS — S0121XA Laceration without foreign body of nose, initial encounter: Secondary | ICD-10-CM | POA: Diagnosis not present

## 2018-05-12 DIAGNOSIS — F0390 Unspecified dementia without behavioral disturbance: Secondary | ICD-10-CM | POA: Insufficient documentation

## 2018-05-12 DIAGNOSIS — Y92122 Bedroom in nursing home as the place of occurrence of the external cause: Secondary | ICD-10-CM | POA: Diagnosis not present

## 2018-05-12 DIAGNOSIS — S79911A Unspecified injury of right hip, initial encounter: Secondary | ICD-10-CM | POA: Diagnosis not present

## 2018-05-12 DIAGNOSIS — Y998 Other external cause status: Secondary | ICD-10-CM | POA: Insufficient documentation

## 2018-05-12 DIAGNOSIS — R220 Localized swelling, mass and lump, head: Secondary | ICD-10-CM | POA: Diagnosis not present

## 2018-05-12 DIAGNOSIS — Z79899 Other long term (current) drug therapy: Secondary | ICD-10-CM | POA: Insufficient documentation

## 2018-05-12 DIAGNOSIS — Y939 Activity, unspecified: Secondary | ICD-10-CM | POA: Diagnosis not present

## 2018-05-12 DIAGNOSIS — N390 Urinary tract infection, site not specified: Secondary | ICD-10-CM | POA: Diagnosis not present

## 2018-05-12 DIAGNOSIS — M25551 Pain in right hip: Secondary | ICD-10-CM | POA: Diagnosis not present

## 2018-05-12 DIAGNOSIS — S0093XA Contusion of unspecified part of head, initial encounter: Secondary | ICD-10-CM | POA: Diagnosis not present

## 2018-05-12 DIAGNOSIS — I959 Hypotension, unspecified: Secondary | ICD-10-CM | POA: Diagnosis not present

## 2018-05-12 HISTORY — DX: Unspecified dementia, unspecified severity, without behavioral disturbance, psychotic disturbance, mood disturbance, and anxiety: F03.90

## 2018-05-12 LAB — URINALYSIS, MICROSCOPIC (REFLEX): Squamous Epithelial / LPF: NONE SEEN (ref 0–5)

## 2018-05-12 LAB — CBC WITH DIFFERENTIAL/PLATELET
Basophils Absolute: 0.1 10*3/uL (ref 0.0–0.1)
Basophils Relative: 1 %
EOS ABS: 0.1 10*3/uL (ref 0.0–0.7)
EOS PCT: 1 %
HCT: 46.8 % — ABNORMAL HIGH (ref 36.0–46.0)
Hemoglobin: 15.1 g/dL — ABNORMAL HIGH (ref 12.0–15.0)
LYMPHS ABS: 1.2 10*3/uL (ref 0.7–4.0)
Lymphocytes Relative: 13 %
MCH: 30.1 pg (ref 26.0–34.0)
MCHC: 32.3 g/dL (ref 30.0–36.0)
MCV: 93.2 fL (ref 78.0–100.0)
MONO ABS: 0.8 10*3/uL (ref 0.1–1.0)
MONOS PCT: 9 %
Neutro Abs: 6.7 10*3/uL (ref 1.7–7.7)
Neutrophils Relative %: 76 %
PLATELETS: 207 10*3/uL (ref 150–400)
RBC: 5.02 MIL/uL (ref 3.87–5.11)
RDW: 13.7 % (ref 11.5–15.5)
WBC: 8.8 10*3/uL (ref 4.0–10.5)

## 2018-05-12 LAB — BASIC METABOLIC PANEL
Anion gap: 8 (ref 5–15)
BUN: 16 mg/dL (ref 8–23)
CALCIUM: 9.9 mg/dL (ref 8.9–10.3)
CO2: 29 mmol/L (ref 22–32)
CREATININE: 1.02 mg/dL — AB (ref 0.44–1.00)
Chloride: 106 mmol/L (ref 98–111)
GFR, EST AFRICAN AMERICAN: 58 mL/min — AB (ref >60)
GFR, EST NON AFRICAN AMERICAN: 50 mL/min — AB (ref >60)
Glucose, Bld: 105 mg/dL — ABNORMAL HIGH (ref 70–99)
Potassium: 4.2 mmol/L (ref 3.5–5.1)
SODIUM: 143 mmol/L (ref 135–145)

## 2018-05-12 LAB — URINALYSIS, ROUTINE W REFLEX MICROSCOPIC
BILIRUBIN URINE: NEGATIVE
Glucose, UA: NEGATIVE mg/dL
KETONES UR: NEGATIVE mg/dL
NITRITE: POSITIVE — AB
Specific Gravity, Urine: 1.015 (ref 1.005–1.030)
pH: 7 (ref 5.0–8.0)

## 2018-05-12 MED ORDER — LIDOCAINE-EPINEPHRINE-TETRACAINE (LET) SOLUTION
3.0000 mL | Freq: Once | NASAL | Status: AC
  Administered 2018-05-12: 3 mL via TOPICAL
  Filled 2018-05-12: qty 3

## 2018-05-12 MED ORDER — TETANUS-DIPHTH-ACELL PERTUSSIS 5-2.5-18.5 LF-MCG/0.5 IM SUSP
0.5000 mL | Freq: Once | INTRAMUSCULAR | Status: AC
  Administered 2018-05-12: 0.5 mL via INTRAMUSCULAR
  Filled 2018-05-12: qty 0.5

## 2018-05-12 MED ORDER — CEPHALEXIN 500 MG PO CAPS: 500.0000 mg | ORAL_CAPSULE | Freq: Two times a day (BID) | ORAL | 0 refills | Status: AC

## 2018-05-12 NOTE — ED Triage Notes (Signed)
EMS reports from [facility] memory care, unwitnessed fall, found on right side, Pt presents with hematoma on forehead, no other visible injury. Hx of dementia.  BP 130/60 HR 56 Resp 16 Sp02 96

## 2018-05-12 NOTE — ED Notes (Signed)
Patient ambulated to bathroom and back without any difficulty.

## 2018-05-12 NOTE — ED Notes (Signed)
Pt ambulated to BR with x1 assist. Tolerated well, pt is a little unsteady.

## 2018-05-12 NOTE — ED Notes (Signed)
Bed: [id number] Expected date:  Expected time:  Means of arrival:  Comments: 84 yo fall, facial injury

## 2018-05-12 NOTE — Discharge Instructions (Addendum)
The patient has a urinary tract infection. Take antibiotics as directed until completed. Keep her well hydrated.   Use ice on her head to help with pain and swelling. Keep wound clean with mild soap and water. Keep area covered with a topical antibiotic ointment and a bandage, keep bandage dry, and do not submerge in water for 24 hours. Alternate between Ibuprofen and Tylenol for pain relief.  Follow up with your primary care doctor in approximately 5-7 days for wound recheck and recheck of symptoms. Monitor wound area for signs of infection to include, but not limited to: increasing pain, spreading redness, drainage/pus, worsening swelling, or fevers. Return to emergency department for emergent changing or worsening symptoms.

## 2018-05-12 NOTE — ED Provider Notes (Signed)
[facility]-EMERGENCY DEPT Provider Note   CSN: [id number] Arrival date & time: 05/12/18  1300     History   Chief Complaint Chief Complaint  Patient presents with  . Fall    HPI Janet Velazquez is a 84 y.o. female with a PMHx of dementia, HTN, HLD, depression, and osteoporosis, who presents to the ED via EMS from [facility], with complaints of unwitnessed fall.  LEVEL 5 CAVEAT DUE TO DEMENTIA, much of the history is provided by the nursing home however that history is still limited; granddaughter is at bedside and provides some minimal history as well.  Per Blessing, med tech at nursing home, patient was found in her room on the floor around 12 PM, had had an unwitnessed fall.  They are not sure when the fall occurred or how she fell.  She denies any recent changes in her mental state or increasing confusion.  She denies any recent illnesses, or any fevers, cough, vomiting, diarrhea, change in urination, or any other complaints or concerns recently.  They are not sure when her last tetanus shot was.  She is not on any blood thinners.  Per granddaughter and daughter (via phone), they are not sure when her last tetanus shot was either.  Not on blood thinners per MAR from nursing home. Her PCP is Dr. Pete Glatter.  Granddaughter and daughter deny any recent illnesses or concerns; granddaughter states that the pt is minimally verbal, when she does speak it usually "doesn't make much sense".  Remainder of HPI/ROS is limited due to pt's dementia and lack of witness to the event today.   The history is provided by medical records, the nursing home and a relative. The history is limited by the condition of the patient. No language interpreter was used.  Fall     Past Medical History:  Diagnosis Date  . Dementia     There are no active problems to display for this patient.    OB History   None      Home Medications    Prior to Admission medications     Medication Sig Start Date End Date Taking? Authorizing Provider  acetaminophen (TYLENOL) 325 MG tablet Take by mouth 3 (three) times daily as needed (pain).   Yes [provider]  amLODipine (NORVASC) 2.5 MG tablet Take 2.5 mg by mouth daily.   Yes [provider]  donepezil (ARICEPT) 10 MG tablet Take 10 mg by mouth at bedtime.   Yes [provider]  loratadine (CLARITIN) 10 MG tablet Take 10 mg by mouth daily.   Yes [provider]  LORazepam (ATIVAN) 0.5 MG tablet Take 0.5 mg by mouth every 8 (eight) hours.   Yes [provider]  memantine (NAMENDA XR) 28 MG CP24 24 hr capsule Take 28 mg by mouth.   Yes [provider]  Polyethyl Glycol-Propyl Glycol (SYSTANE OP) Place 1 drop into both eyes daily.   Yes [provider]  sertraline (ZOLOFT) 25 MG tablet Take 25 mg by mouth daily.   Yes [provider]  Vitamin D, Ergocalciferol, (DRISDOL) 50000 units CAPS capsule Take 50,000 Units by mouth every 7 (seven) days. On Wednesday   Yes [provider]    Family History No family history on file.  Social History Social History   Tobacco Use  . Smoking status: Not on file  Substance Use Topics  . Alcohol use: Not on file  . Drug use: Not on file  Allergies   Patient has no allergy information on record.   Review of Systems Review of Systems  Unable to perform ROS: Dementia  Constitutional: Negative for fever.  Respiratory: Negative for cough.   Gastrointestinal: Negative for diarrhea and vomiting.  Genitourinary:       No changes in urination  Skin: Positive for wound.  Allergic/Immunologic: Negative for immunocompromised state.  Hematological: Does not bruise/bleed easily.  Psychiatric/Behavioral: Negative for confusion (no worsening confusion).   LEVEL 5 CAVEAT DUE TO DEMENTIA    Physical Exam Updated Vital Signs BP (!) 136/92   Pulse 60   Temp (!) 97.4 F (36.3 C) (Axillary)   Resp 16    SpO2 99%    Physical Exam  Constitutional: Vital signs are normal. She appears well-developed and well-nourished.  Non-toxic appearance. No distress.  Afebrile, nontoxic, NAD, minimally verbal at baseline  HENT:  Head: Normocephalic. Head is with contusion. Head is without raccoon's eyes and without Battle's sign.  Nose: Nose lacerations present. No nasal deformity or nasal septal hematoma. No epistaxis.  Mouth/Throat: Oropharynx is clear and moist and mucous membranes are normal.  Very small linear laceration to nasal bridge, no ongoing bleeding or retained FBs, minimally tender to palpation, no nasal deformity or septal hematoma/epistaxis. Small hematoma to center of forehead. No scalp or facial crepitus or deformity, no other areas of tenderness to remainder of face/scalp. No racoon eyes or battle's sign.   Eyes: Pupils are equal, round, and reactive to light. Conjunctivae and EOM are normal. Right eye exhibits no discharge. Left eye exhibits no discharge.  Visual tracking normal, EOM appear grossly intact, PERRL  Neck: Normal range of motion. Neck supple. No spinous process tenderness and no muscular tenderness present.  Towel wrapped around neck to maintain C-spine, however pt still moving her neck fairly freely. No focal bony or midline spinal TTP, no stepoffs or deformities.   Cardiovascular: Normal rate, regular rhythm, normal heart sounds and intact distal pulses. Exam reveals no gallop and no friction rub.  No murmur heard. Pulmonary/Chest: Effort normal and breath sounds normal. No respiratory distress. She has no decreased breath sounds. She has no wheezes. She has no rhonchi. She has no rales.  Abdominal: Soft. Normal appearance and bowel sounds are normal. She exhibits no distension. There is no tenderness. There is no rigidity, no rebound, no guarding and no tenderness at McBurney's point.  Musculoskeletal: Normal range of motion.       Right hip: She exhibits tenderness. She  exhibits normal range of motion, no swelling, no crepitus and no deformity.  Not very cooperative with exam, however appears to have symmetric strength in all extremities as she is able to squeeze my hands bilaterally and she moves her legs around the bed bilaterally, crosses her legs twice during exam.  Mild tenderness to R hip area, no crepitus or deformity, no bruising or abrasions, no limb length discrepancy or abnormal rotation.  Sensation appears grossly intact in all extremities Distal pulses intact No other areas of tenderness or evidence of trauma to remainder of extremities.   Neurological: She is alert. She has normal strength. No sensory deficit. GCS eye subscore is 4. GCS motor subscore is 5.  Minimally verbal at baseline. Doesn't really answer questions or speak; a few times she mumbles a few words but doesn't really form a sentence. No slurring of speech. Localizes to pain.   Skin: Skin is warm and dry. Laceration noted. No rash noted.  Small lac to nasal bridge  as mentioned above  Psychiatric: She has a normal mood and affect.  Nursing note and vitals reviewed.    ED Treatments / Results  Labs (all labs ordered are listed, but only abnormal results are displayed) Labs Reviewed  CBC WITH DIFFERENTIAL/PLATELET - Abnormal; Notable for the following components:      Result Value   Hemoglobin 15.1 (*)    HCT 46.8 (*)    All other components within normal limits  BASIC METABOLIC PANEL - Abnormal; Notable for the following components:   Glucose, Bld 105 (*)    Creatinine, Ser 1.02 (*)    GFR calc non Af Amer 50 (*)    GFR calc Af Amer 58 (*)    All other components within normal limits  URINALYSIS, ROUTINE W REFLEX MICROSCOPIC - Abnormal; Notable for the following components:   APPearance TURBID (*)    Hgb urine dipstick SMALL (*)    Protein, ur TRACE (*)    Nitrite POSITIVE (*)    Leukocytes, UA LARGE (*)    All other components within normal limits  URINALYSIS,  MICROSCOPIC (REFLEX) - Abnormal; Notable for the following components:   Bacteria, UA MANY (*)    All other components within normal limits  URINE CULTURE    EKG None  Radiology Ct Head Wo Contrast  Result Date: 05/12/2018 CLINICAL DATA:  Unwitnessed fall with forehead hematoma and neck pain, initial encounter EXAM: CT HEAD WITHOUT CONTRAST CT CERVICAL SPINE WITHOUT CONTRAST TECHNIQUE: Multidetector CT imaging of the head and cervical spine was performed following the standard protocol without intravenous contrast. Multiplanar CT image reconstructions of the cervical spine were also generated. COMPARISON:  None. FINDINGS: CT HEAD FINDINGS Brain: Chronic atrophic and ischemic changes are noted. No findings to suggest acute hemorrhage, acute infarction or space-occupying mass lesion are seen. Vascular: No hyperdense vessel or unexpected calcification. Skull: Normal. Negative for fracture or focal lesion. Sinuses/Orbits: No acute finding. Other: Mild forehead swelling is noted consistent with the recent injury. CT CERVICAL SPINE FINDINGS Alignment: Mild loss of the normal cervical lordosis is noted which may be related to muscular spasm. Skull base and vertebrae: 7 cervical segments are well visualized. Vertebral body height is well maintained. Facet hypertrophic changes are noted at multiple levels as are osteophytic changes. No acute fracture or acute facet abnormality is noted. Soft tissues and spinal canal: Surrounding soft tissues are within normal limits. Upper chest: No acute abnormality is noted in the upper chest. Other: None IMPRESSION: CT of the head: Chronic atrophic and ischemic changes without acute abnormality. CT of the cervical spine: Multilevel degenerative change without acute abnormality. Electronically Signed   By: Alcide CleverMark  Lukens M.D.   On: 05/12/2018 15:24   Ct Cervical Spine Wo Contrast  Result Date: 05/12/2018 CLINICAL DATA:  Unwitnessed fall with forehead hematoma and neck pain,  initial encounter EXAM: CT HEAD WITHOUT CONTRAST CT CERVICAL SPINE WITHOUT CONTRAST TECHNIQUE: Multidetector CT imaging of the head and cervical spine was performed following the standard protocol without intravenous contrast. Multiplanar CT image reconstructions of the cervical spine were also generated. COMPARISON:  None. FINDINGS: CT HEAD FINDINGS Brain: Chronic atrophic and ischemic changes are noted. No findings to suggest acute hemorrhage, acute infarction or space-occupying mass lesion are seen. Vascular: No hyperdense vessel or unexpected calcification. Skull: Normal. Negative for fracture or focal lesion. Sinuses/Orbits: No acute finding. Other: Mild forehead swelling is noted consistent with the recent injury. CT CERVICAL SPINE FINDINGS Alignment: Mild loss of the normal cervical lordosis is  noted which may be related to muscular spasm. Skull base and vertebrae: 7 cervical segments are well visualized. Vertebral body height is well maintained. Facet hypertrophic changes are noted at multiple levels as are osteophytic changes. No acute fracture or acute facet abnormality is noted. Soft tissues and spinal canal: Surrounding soft tissues are within normal limits. Upper chest: No acute abnormality is noted in the upper chest. Other: None IMPRESSION: CT of the head: Chronic atrophic and ischemic changes without acute abnormality. CT of the cervical spine: Multilevel degenerative change without acute abnormality. Electronically Signed   By: Alcide Clever M.D.   On: 05/12/2018 15:24   Dg Hip Unilat W Or Wo Pelvis 2-3 Views Right  Result Date: 05/12/2018 CLINICAL DATA:  Fall.  Pain. EXAM: DG HIP (WITH OR WITHOUT PELVIS) 2-3V RIGHT COMPARISON:  None. FINDINGS: There is no evidence of hip fracture or dislocation. There is no evidence of arthropathy or other focal bone abnormality. IMPRESSION: Negative. Electronically Signed   By: Gerome Sam III M.D   On: 05/12/2018 15:54    Procedures .Marland KitchenLaceration  Repair Date/Time: 05/12/2018 4:11 PM Performed by: Rhona Raider, PA-C Authorized by: Rhona Raider, [place]   Consent:    Consent obtained:  Verbal   Consent given by:  Guardian (daughter via phone)   Risks discussed:  Pain   Alternatives discussed:  No treatment Anesthesia (see MAR for exact dosages):    Anesthesia method:  Topical application   Topical anesthetic:  LET Laceration details:    Location:  Face   Face location:  Nose   Length (cm):  2   Depth (mm):  3 Repair type:    Repair type:  Simple Pre-procedure details:    Preparation:  Patient was prepped and draped in usual sterile fashion and imaging obtained to evaluate for foreign bodies Exploration:    Hemostasis achieved with:  Direct pressure   Wound exploration: wound explored through full range of motion and entire depth of wound probed and visualized     Wound extent: no foreign bodies/material noted and no underlying fracture noted     Contaminated: no   Treatment:    Area cleansed with:  Saline   Amount of cleaning:  Standard   Irrigation solution:  Sterile saline   Irrigation method:  Syringe Skin repair:    Repair method:  Tissue adhesive Approximation:    Approximation:  Close Post-procedure details:    Dressing:  Open (no dressing)   Patient tolerance of procedure:  Tolerated well, no immediate complications   (including critical care time)  Medications Ordered in ED Medications  Tdap (BOOSTRIX) injection 0.5 mL (0.5 mLs Intramuscular Given 05/12/18 1528)  lidocaine-EPINEPHrine-tetracaine (LET) solution (3 mLs Topical Given 05/12/18 1529)     Initial Impression / Assessment and Plan / ED Course  I have reviewed the triage vital signs and the nursing notes.  Pertinent labs & imaging results that were available during my care of the patient were reviewed by me and considered in my medical decision making (see chart for details).     84 y.o. female here with unwitnessed fall. Unable to  provide any history. On exam, small linear laceration to nasal bridge without ongoing bleeding or retained FBs, no septal hematoma or epistaxis, no scalp crepitus or deformity, small hematoma to center of forehead, no cervical spine tenderness, mild tenderness to R hip region, no bruising or crepitus there, no limb length discrepancy or abnormal rotation. No other areas of tenderness or evidence of injury.  Per MAR not on blood thinners. Spoke with nursing home staff who stated it was an unwitnessed fall, they deny any recent illness or any symptoms otherwise. Unknown last Tdap, will update today. Nasal lac will be amendable to dermabond, will place LET for comfort; will obtain CT head/neck, and R hip xray. Will get basic labs and reassess shortly. Discussed case with my attending Dr. Effie Shy who agrees with plan.   4:25 PM CBC w/diff mildly hemoconcentrated with Hgb 15.1/Hct 46.8 but otherwise unremarkable. BMP fairly unremarkable (marginally elevated Cr 1.02, unclear baseline, but otherwise remainder of BMP WNL). U/A not yet done, will attempt to get this done ASAP. CT head/neck negative for acute findings. R hip xray negative for acute findings. Doubt occult hip fx given that the pt is able to move the hip freely, will ensure pt can ambulate. Wound fixed with dermabond for improved cosmesis and quicker healing time, informed granddaughter of wound care instructions, which will also be given to the nursing home staff. Doubt need for empiric abx. Will continue to await U/A and reassess shortly.   6:46 PM Still waiting on U/A. I have asked nursing staff to please perform I&O cath to assist in getting urine sample. Will reassess shortly.   8:25 PM U/A with +nitrites, large leuks, many bacteria, >50 WBCs all c/w UTI; will send for UCx given age, and will send home with abx for this. I suspect this is why she fell. Pt ambulated without difficulty. Pt ready for d/c at this time. Unfortunately pt has picked the  dermabond nearly entirely off, but wound edges still well approximated and no ongoing bleeding at this time; will allow wound to heal via secondary intent, doubt it'd be helpful at this point to repeat dermabond. Advised tylenol/motrin/ice use, proper wound care, adequate hydration, use of abx for UTI, and f/up with PCP in 5-7 days for recheck. I explained the diagnosis and have given explicit precautions to return to the ER including for any other new or worsening symptoms. The patient's family understands and accepts the medical plan as it's been dictated and I have answered their questions. Discharge instructions concerning home care and prescriptions have been given. The patient is STABLE and is discharged to home in good condition.    Final Clinical Impressions(s) / ED Diagnoses   Final diagnoses:  Fall, initial encounter  Laceration of nose, initial encounter  Contusion of forehead, initial encounter  Lower urinary tract infectious disease    ED Discharge Orders        Ordered    cephALEXin (KEFLEX) 500 MG capsule  2 times daily     05/12/18 [address], [place], [place] 05/12/18 2025    Mancel Bale, MD 05/13/18 1756

## 2018-05-12 NOTE — ED Provider Notes (Signed)
  Face-to-face evaluation   History: Patient was found on floor of her nursing care facility, after suspected unwitnessed fall.  Reported contusion to forehead.  Patient is unable to give history.  Physical exam: Alert elderly female.  She is confused.  She is responsive.  Wound on nose has been treated with adhesive, with good closure.  She is able to sit up easily and does not have ataxia.  Medical screening examination/treatment/procedure(s) were conducted as a shared visit with non-physician practitioner(s) and myself.  I personally evaluated the patient during the encounter    Mancel BaleWentz, Amiylah Anastos, MD 05/13/18 1756

## 2018-05-15 LAB — URINE CULTURE: Culture: >=100000 — AB

## 2018-05-16 ENCOUNTER — Telehealth: Payer: Self-pay | Admitting: *Deleted

## 2018-05-16 DIAGNOSIS — N390 Urinary tract infection, site not specified: Secondary | ICD-10-CM | POA: Diagnosis not present

## 2018-05-16 DIAGNOSIS — T148XXA Other injury of unspecified body region, initial encounter: Secondary | ICD-10-CM | POA: Diagnosis not present

## 2018-05-16 DIAGNOSIS — G301 Alzheimer's disease with late onset: Secondary | ICD-10-CM | POA: Diagnosis not present

## 2018-05-16 DIAGNOSIS — I10 Essential (primary) hypertension: Secondary | ICD-10-CM | POA: Diagnosis not present

## 2018-05-16 NOTE — Telephone Encounter (Signed)
Post ED Visit - Positive Culture Follow-up  Culture report reviewed by antimicrobial stewardship pharmacist:  []  Enzo BiNathan Batchelder, Pharm.D. []  Celedonio MiyamotoJeremy Frens, Pharm.D., BCPS AQ-ID []  Garvin FilaMike Maccia, Pharm.D., BCPS []  Georgina PillionElizabeth Martin, Pharm.D., BCPS []  BeaverdaleMinh Pham, VermontPharm.D., BCPS, AAHIVP []  Estella HuskMichelle Turner, Pharm.D., BCPS, AAHIVP [x]  Lysle Pearlachel Rumbarger, PharmD, BCPS []  Phillips Climeshuy Dang, PharmD, BCPS []  Agapito GamesAlison Masters, PharmD, BCPS []  Verlan FriendsErin Deja, PharmD  Positive urine culture Treated with Cephalexin, organism sensitive to the same and no further patient follow-up is required at this time.  Virl AxeRobertson, Qais [facility]ers [facility] 05/16/2018, 9:46 AM

## 2018-07-20 DIAGNOSIS — Z23 Encounter for immunization: Secondary | ICD-10-CM | POA: Diagnosis not present

## 2018-07-20 DIAGNOSIS — I10 Essential (primary) hypertension: Secondary | ICD-10-CM | POA: Diagnosis not present

## 2018-07-20 DIAGNOSIS — F325 Major depressive disorder, single episode, in full remission: Secondary | ICD-10-CM | POA: Diagnosis not present

## 2018-07-20 DIAGNOSIS — D692 Other nonthrombocytopenic purpura: Secondary | ICD-10-CM | POA: Diagnosis not present

## 2018-07-20 DIAGNOSIS — G301 Alzheimer's disease with late onset: Secondary | ICD-10-CM | POA: Diagnosis not present

## 2018-09-19 DIAGNOSIS — I10 Essential (primary) hypertension: Secondary | ICD-10-CM | POA: Diagnosis not present

## 2018-09-19 DIAGNOSIS — G301 Alzheimer's disease with late onset: Secondary | ICD-10-CM | POA: Diagnosis not present

## 2018-12-07 DIAGNOSIS — G301 Alzheimer's disease with late onset: Secondary | ICD-10-CM | POA: Diagnosis not present

## 2018-12-07 DIAGNOSIS — Z79899 Other long term (current) drug therapy: Secondary | ICD-10-CM | POA: Diagnosis not present

## 2018-12-07 DIAGNOSIS — R269 Unspecified abnormalities of gait and mobility: Secondary | ICD-10-CM | POA: Diagnosis not present

## 2018-12-07 DIAGNOSIS — I10 Essential (primary) hypertension: Secondary | ICD-10-CM | POA: Diagnosis not present

## 2018-12-14 DIAGNOSIS — I739 Peripheral vascular disease, unspecified: Secondary | ICD-10-CM | POA: Diagnosis not present

## 2018-12-14 DIAGNOSIS — L603 Nail dystrophy: Secondary | ICD-10-CM | POA: Diagnosis not present

## 2018-12-14 DIAGNOSIS — B351 Tinea unguium: Secondary | ICD-10-CM | POA: Diagnosis not present

## 2018-12-14 DIAGNOSIS — Q845 Enlarged and hypertrophic nails: Secondary | ICD-10-CM | POA: Diagnosis not present

## 2018-12-17 DIAGNOSIS — R262 Difficulty in walking, not elsewhere classified: Secondary | ICD-10-CM | POA: Diagnosis not present

## 2018-12-17 DIAGNOSIS — M6281 Muscle weakness (generalized): Secondary | ICD-10-CM | POA: Diagnosis not present

## 2018-12-18 DIAGNOSIS — R262 Difficulty in walking, not elsewhere classified: Secondary | ICD-10-CM | POA: Diagnosis not present

## 2018-12-18 DIAGNOSIS — M6281 Muscle weakness (generalized): Secondary | ICD-10-CM | POA: Diagnosis not present

## 2018-12-19 DIAGNOSIS — R262 Difficulty in walking, not elsewhere classified: Secondary | ICD-10-CM | POA: Diagnosis not present

## 2018-12-19 DIAGNOSIS — M6281 Muscle weakness (generalized): Secondary | ICD-10-CM | POA: Diagnosis not present

## 2018-12-26 DIAGNOSIS — M6281 Muscle weakness (generalized): Secondary | ICD-10-CM | POA: Diagnosis not present

## 2018-12-26 DIAGNOSIS — R262 Difficulty in walking, not elsewhere classified: Secondary | ICD-10-CM | POA: Diagnosis not present

## 2018-12-27 DIAGNOSIS — M6281 Muscle weakness (generalized): Secondary | ICD-10-CM | POA: Diagnosis not present

## 2018-12-27 DIAGNOSIS — R262 Difficulty in walking, not elsewhere classified: Secondary | ICD-10-CM | POA: Diagnosis not present

## 2018-12-28 DIAGNOSIS — M6281 Muscle weakness (generalized): Secondary | ICD-10-CM | POA: Diagnosis not present

## 2018-12-28 DIAGNOSIS — R262 Difficulty in walking, not elsewhere classified: Secondary | ICD-10-CM | POA: Diagnosis not present

## 2018-12-31 DIAGNOSIS — R262 Difficulty in walking, not elsewhere classified: Secondary | ICD-10-CM | POA: Diagnosis not present

## 2018-12-31 DIAGNOSIS — M6281 Muscle weakness (generalized): Secondary | ICD-10-CM | POA: Diagnosis not present

## 2019-01-01 DIAGNOSIS — M6281 Muscle weakness (generalized): Secondary | ICD-10-CM | POA: Diagnosis not present

## 2019-01-01 DIAGNOSIS — R262 Difficulty in walking, not elsewhere classified: Secondary | ICD-10-CM | POA: Diagnosis not present

## 2019-01-02 DIAGNOSIS — M6281 Muscle weakness (generalized): Secondary | ICD-10-CM | POA: Diagnosis not present

## 2019-01-02 DIAGNOSIS — R262 Difficulty in walking, not elsewhere classified: Secondary | ICD-10-CM | POA: Diagnosis not present

## 2019-01-04 DIAGNOSIS — R262 Difficulty in walking, not elsewhere classified: Secondary | ICD-10-CM | POA: Diagnosis not present

## 2019-01-04 DIAGNOSIS — M6281 Muscle weakness (generalized): Secondary | ICD-10-CM | POA: Diagnosis not present

## 2019-01-07 DIAGNOSIS — R262 Difficulty in walking, not elsewhere classified: Secondary | ICD-10-CM | POA: Diagnosis not present

## 2019-01-07 DIAGNOSIS — M6281 Muscle weakness (generalized): Secondary | ICD-10-CM | POA: Diagnosis not present

## 2019-01-08 DIAGNOSIS — R262 Difficulty in walking, not elsewhere classified: Secondary | ICD-10-CM | POA: Diagnosis not present

## 2019-01-08 DIAGNOSIS — M6281 Muscle weakness (generalized): Secondary | ICD-10-CM | POA: Diagnosis not present

## 2019-01-09 DIAGNOSIS — M6281 Muscle weakness (generalized): Secondary | ICD-10-CM | POA: Diagnosis not present

## 2019-01-09 DIAGNOSIS — R262 Difficulty in walking, not elsewhere classified: Secondary | ICD-10-CM | POA: Diagnosis not present

## 2019-01-10 DIAGNOSIS — R262 Difficulty in walking, not elsewhere classified: Secondary | ICD-10-CM | POA: Diagnosis not present

## 2019-01-10 DIAGNOSIS — M6281 Muscle weakness (generalized): Secondary | ICD-10-CM | POA: Diagnosis not present

## 2019-01-11 DIAGNOSIS — R262 Difficulty in walking, not elsewhere classified: Secondary | ICD-10-CM | POA: Diagnosis not present

## 2019-01-11 DIAGNOSIS — M6281 Muscle weakness (generalized): Secondary | ICD-10-CM | POA: Diagnosis not present

## 2019-01-14 DIAGNOSIS — R633 Feeding difficulties: Secondary | ICD-10-CM | POA: Diagnosis not present

## 2019-01-14 DIAGNOSIS — M6281 Muscle weakness (generalized): Secondary | ICD-10-CM | POA: Diagnosis not present

## 2019-01-14 DIAGNOSIS — R262 Difficulty in walking, not elsewhere classified: Secondary | ICD-10-CM | POA: Diagnosis not present

## 2019-01-14 DIAGNOSIS — R488 Other symbolic dysfunctions: Secondary | ICD-10-CM | POA: Diagnosis not present

## 2019-01-15 DIAGNOSIS — M6281 Muscle weakness (generalized): Secondary | ICD-10-CM | POA: Diagnosis not present

## 2019-01-15 DIAGNOSIS — R633 Feeding difficulties: Secondary | ICD-10-CM | POA: Diagnosis not present

## 2019-01-15 DIAGNOSIS — R488 Other symbolic dysfunctions: Secondary | ICD-10-CM | POA: Diagnosis not present

## 2019-01-15 DIAGNOSIS — R262 Difficulty in walking, not elsewhere classified: Secondary | ICD-10-CM | POA: Diagnosis not present

## 2019-01-16 DIAGNOSIS — R488 Other symbolic dysfunctions: Secondary | ICD-10-CM | POA: Diagnosis not present

## 2019-01-16 DIAGNOSIS — R262 Difficulty in walking, not elsewhere classified: Secondary | ICD-10-CM | POA: Diagnosis not present

## 2019-01-16 DIAGNOSIS — M6281 Muscle weakness (generalized): Secondary | ICD-10-CM | POA: Diagnosis not present

## 2019-01-16 DIAGNOSIS — R633 Feeding difficulties: Secondary | ICD-10-CM | POA: Diagnosis not present

## 2019-01-17 DIAGNOSIS — R488 Other symbolic dysfunctions: Secondary | ICD-10-CM | POA: Diagnosis not present

## 2019-01-17 DIAGNOSIS — R633 Feeding difficulties: Secondary | ICD-10-CM | POA: Diagnosis not present

## 2019-01-17 DIAGNOSIS — R262 Difficulty in walking, not elsewhere classified: Secondary | ICD-10-CM | POA: Diagnosis not present

## 2019-01-17 DIAGNOSIS — M6281 Muscle weakness (generalized): Secondary | ICD-10-CM | POA: Diagnosis not present

## 2019-01-18 DIAGNOSIS — R633 Feeding difficulties: Secondary | ICD-10-CM | POA: Diagnosis not present

## 2019-01-18 DIAGNOSIS — R488 Other symbolic dysfunctions: Secondary | ICD-10-CM | POA: Diagnosis not present

## 2019-01-18 DIAGNOSIS — R262 Difficulty in walking, not elsewhere classified: Secondary | ICD-10-CM | POA: Diagnosis not present

## 2019-01-18 DIAGNOSIS — M6281 Muscle weakness (generalized): Secondary | ICD-10-CM | POA: Diagnosis not present

## 2019-01-21 DIAGNOSIS — M6281 Muscle weakness (generalized): Secondary | ICD-10-CM | POA: Diagnosis not present

## 2019-01-21 DIAGNOSIS — R262 Difficulty in walking, not elsewhere classified: Secondary | ICD-10-CM | POA: Diagnosis not present

## 2019-01-21 DIAGNOSIS — R488 Other symbolic dysfunctions: Secondary | ICD-10-CM | POA: Diagnosis not present

## 2019-01-21 DIAGNOSIS — R633 Feeding difficulties: Secondary | ICD-10-CM | POA: Diagnosis not present

## 2019-01-22 DIAGNOSIS — R633 Feeding difficulties: Secondary | ICD-10-CM | POA: Diagnosis not present

## 2019-01-22 DIAGNOSIS — M6281 Muscle weakness (generalized): Secondary | ICD-10-CM | POA: Diagnosis not present

## 2019-01-22 DIAGNOSIS — R262 Difficulty in walking, not elsewhere classified: Secondary | ICD-10-CM | POA: Diagnosis not present

## 2019-01-22 DIAGNOSIS — R488 Other symbolic dysfunctions: Secondary | ICD-10-CM | POA: Diagnosis not present

## 2019-01-23 DIAGNOSIS — R488 Other symbolic dysfunctions: Secondary | ICD-10-CM | POA: Diagnosis not present

## 2019-01-23 DIAGNOSIS — M6281 Muscle weakness (generalized): Secondary | ICD-10-CM | POA: Diagnosis not present

## 2019-01-23 DIAGNOSIS — R633 Feeding difficulties: Secondary | ICD-10-CM | POA: Diagnosis not present

## 2019-01-23 DIAGNOSIS — R262 Difficulty in walking, not elsewhere classified: Secondary | ICD-10-CM | POA: Diagnosis not present

## 2019-01-24 DIAGNOSIS — R633 Feeding difficulties: Secondary | ICD-10-CM | POA: Diagnosis not present

## 2019-01-24 DIAGNOSIS — R488 Other symbolic dysfunctions: Secondary | ICD-10-CM | POA: Diagnosis not present

## 2019-01-24 DIAGNOSIS — M6281 Muscle weakness (generalized): Secondary | ICD-10-CM | POA: Diagnosis not present

## 2019-01-24 DIAGNOSIS — R262 Difficulty in walking, not elsewhere classified: Secondary | ICD-10-CM | POA: Diagnosis not present

## 2019-01-25 DIAGNOSIS — R633 Feeding difficulties: Secondary | ICD-10-CM | POA: Diagnosis not present

## 2019-01-25 DIAGNOSIS — R262 Difficulty in walking, not elsewhere classified: Secondary | ICD-10-CM | POA: Diagnosis not present

## 2019-01-25 DIAGNOSIS — R488 Other symbolic dysfunctions: Secondary | ICD-10-CM | POA: Diagnosis not present

## 2019-01-25 DIAGNOSIS — M6281 Muscle weakness (generalized): Secondary | ICD-10-CM | POA: Diagnosis not present

## 2019-01-28 DIAGNOSIS — R488 Other symbolic dysfunctions: Secondary | ICD-10-CM | POA: Diagnosis not present

## 2019-01-28 DIAGNOSIS — R633 Feeding difficulties: Secondary | ICD-10-CM | POA: Diagnosis not present

## 2019-01-28 DIAGNOSIS — R262 Difficulty in walking, not elsewhere classified: Secondary | ICD-10-CM | POA: Diagnosis not present

## 2019-01-28 DIAGNOSIS — M6281 Muscle weakness (generalized): Secondary | ICD-10-CM | POA: Diagnosis not present

## 2019-01-29 DIAGNOSIS — R488 Other symbolic dysfunctions: Secondary | ICD-10-CM | POA: Diagnosis not present

## 2019-01-29 DIAGNOSIS — R262 Difficulty in walking, not elsewhere classified: Secondary | ICD-10-CM | POA: Diagnosis not present

## 2019-01-29 DIAGNOSIS — R633 Feeding difficulties: Secondary | ICD-10-CM | POA: Diagnosis not present

## 2019-01-29 DIAGNOSIS — M6281 Muscle weakness (generalized): Secondary | ICD-10-CM | POA: Diagnosis not present

## 2019-01-30 DIAGNOSIS — R488 Other symbolic dysfunctions: Secondary | ICD-10-CM | POA: Diagnosis not present

## 2019-01-30 DIAGNOSIS — R633 Feeding difficulties: Secondary | ICD-10-CM | POA: Diagnosis not present

## 2019-01-30 DIAGNOSIS — R262 Difficulty in walking, not elsewhere classified: Secondary | ICD-10-CM | POA: Diagnosis not present

## 2019-01-30 DIAGNOSIS — M6281 Muscle weakness (generalized): Secondary | ICD-10-CM | POA: Diagnosis not present

## 2019-01-31 DIAGNOSIS — R633 Feeding difficulties: Secondary | ICD-10-CM | POA: Diagnosis not present

## 2019-01-31 DIAGNOSIS — R262 Difficulty in walking, not elsewhere classified: Secondary | ICD-10-CM | POA: Diagnosis not present

## 2019-01-31 DIAGNOSIS — R488 Other symbolic dysfunctions: Secondary | ICD-10-CM | POA: Diagnosis not present

## 2019-01-31 DIAGNOSIS — M6281 Muscle weakness (generalized): Secondary | ICD-10-CM | POA: Diagnosis not present

## 2019-01-31 IMAGING — CT CT CERVICAL SPINE W/O CM
4 of 7 series · 14 of 33 positions shown, 15 images · non-contrast
Comparison: None.

CLINICAL DATA: Unwitnessed fall with forehead hematoma and neck
pain, initial encounter

EXAM:
CT HEAD WITHOUT CONTRAST
CT CERVICAL SPINE WITHOUT CONTRAST
TECHNIQUE: Multidetector CT imaging of the head and cervical spine was
performed following the standard protocol without intravenous
contrast. Multiplanar CT image reconstructions of the cervical spine
were also generated.

[Series 8: c spine soft · axial · 0.29mm/px · z∈[-259,-167]mm · 4 of 78 slices shown]
[im 16/78  soft-tissue]
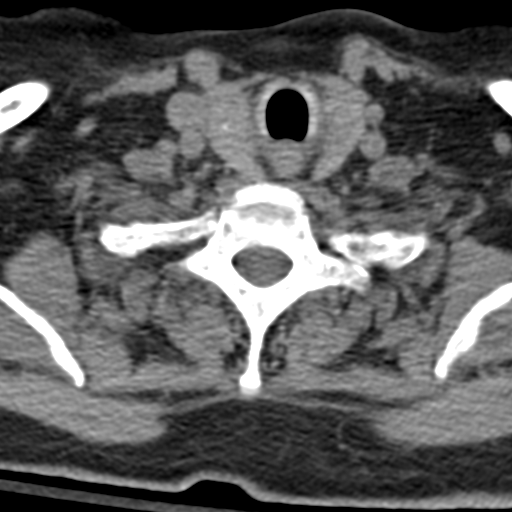
[im 31/78  soft-tissue]
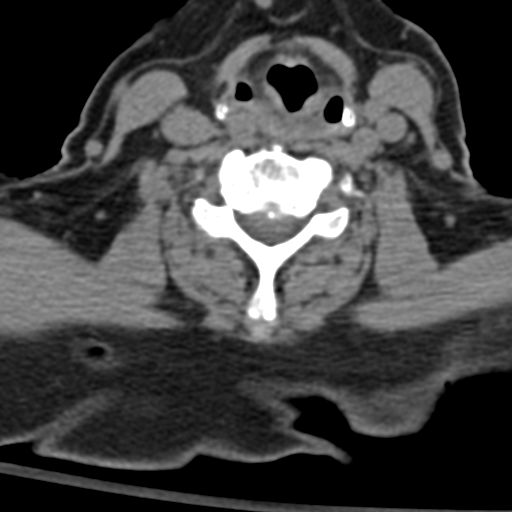
[im 47/78  soft-tissue]
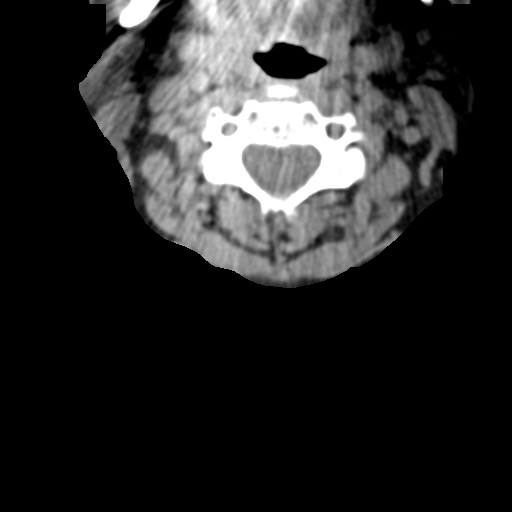
[im 62/78  soft-tissue]
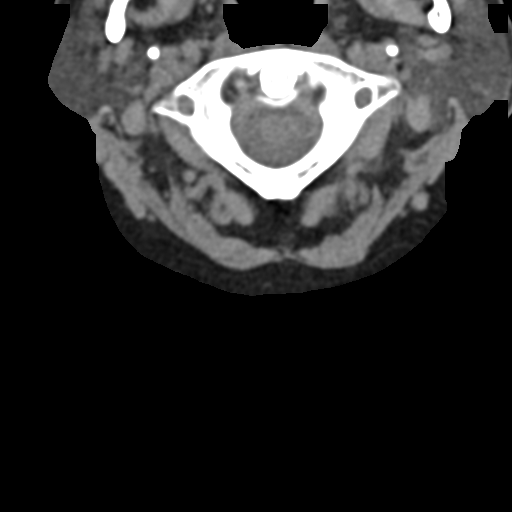

[Series 10: orthogonal bone · axial · 0.23mm/px · z∈[-282,-193]mm · 4 of 82 slices shown, 5 images]
[im 17/82  soft-tissue]
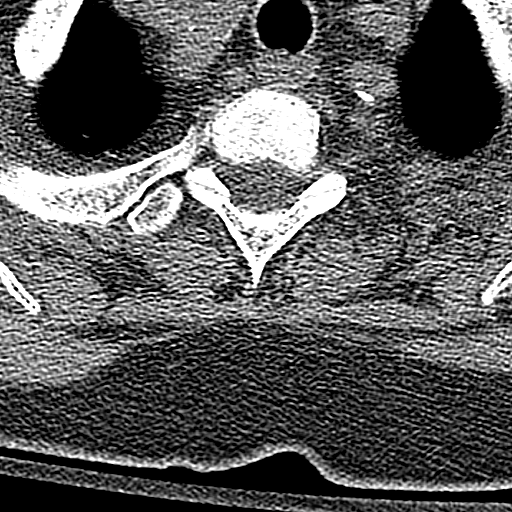
[im 17/82  bone]
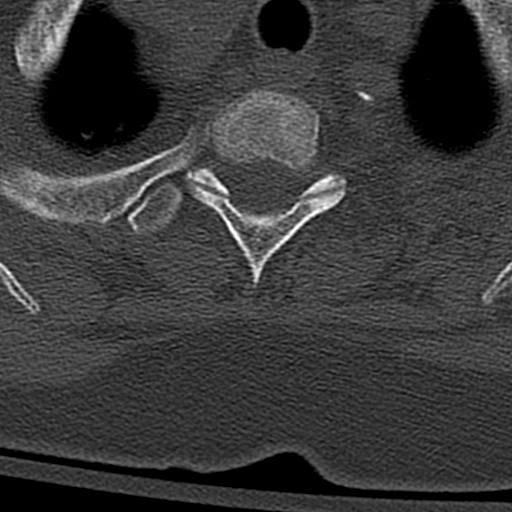
[im 33/82  bone]
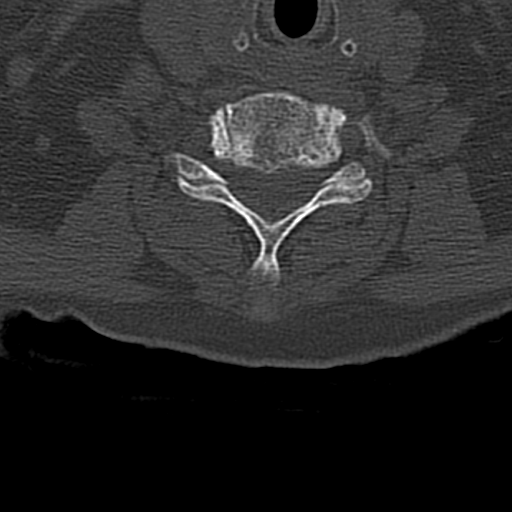
[im 49/82  bone]
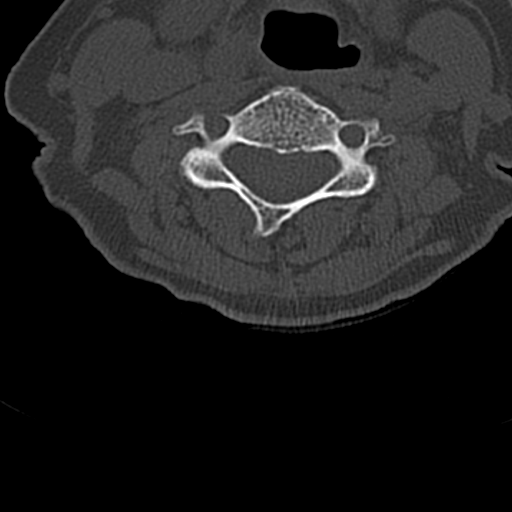
[im 65/82  bone]
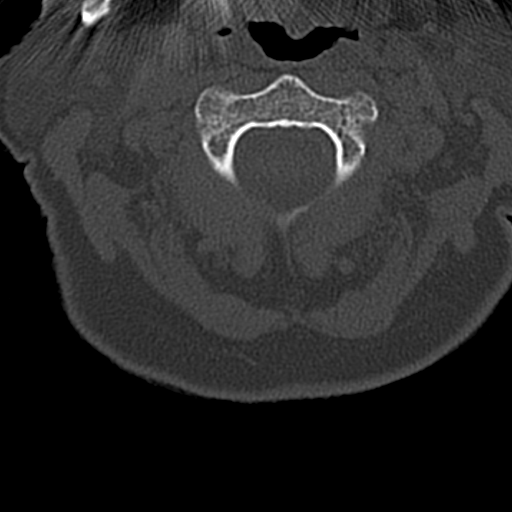

[Series 11: coronal bone · coronal · 0.23mm/px · 1 of 61 slices shown]
[im 31/61  bone]
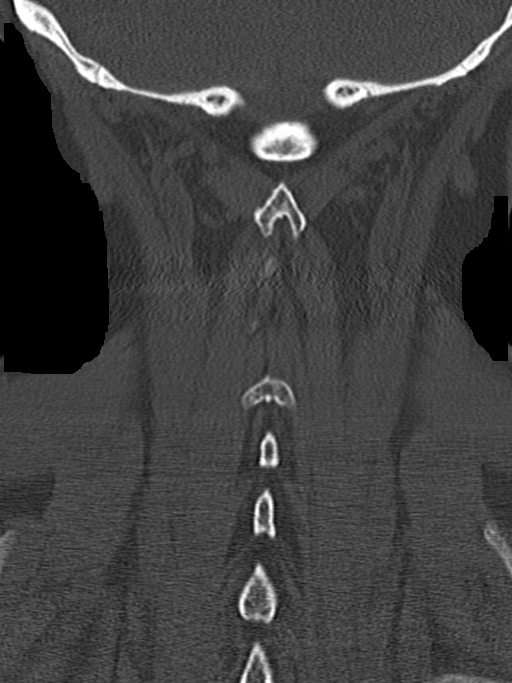

[Series 12: sagittal bone · sagittal · 0.23mm/px · 5 of 61 slices shown]
[im 11/61  bone]
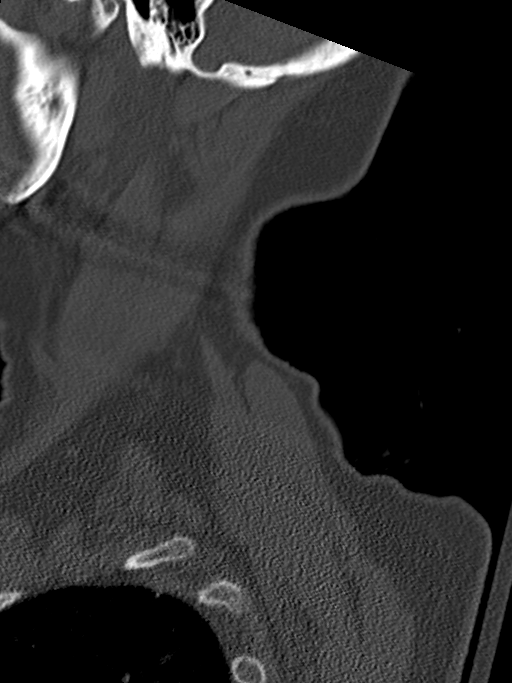
[im 21/61  bone]
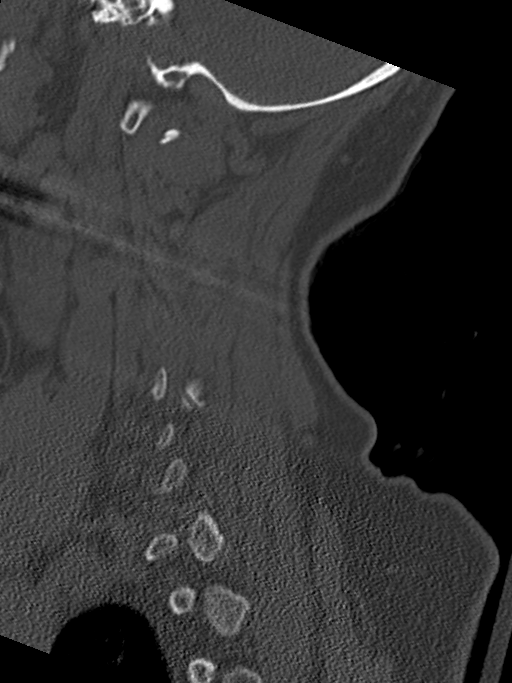
[im 31/61  bone]
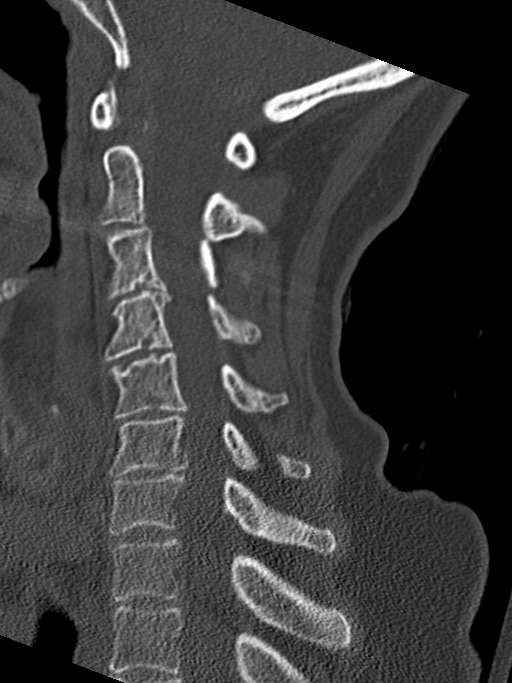
[im 41/61  bone]
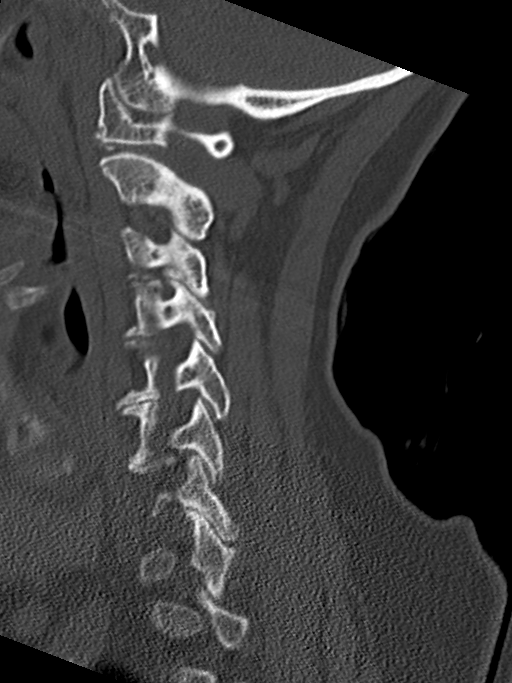
[im 51/61  bone]
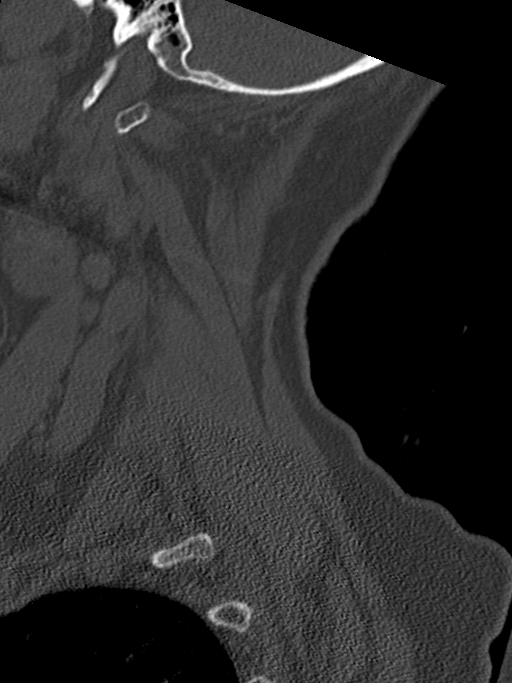

[14 of 33 positions shown; findings below may reference images not displayed]

FINDINGS: CT HEAD FINDINGS

Brain: Chronic atrophic and ischemic changes are noted. No findings
to suggest acute hemorrhage, acute infarction or space-occupying
mass lesion are seen.

Vascular: No hyperdense vessel or unexpected calcification.

Skull: Normal. Negative for fracture or focal lesion.

Sinuses/Orbits: No acute finding.

Other: Mild forehead swelling is noted consistent with the recent
injury.

CT CERVICAL SPINE FINDINGS

Alignment: Mild loss of the normal cervical lordosis is noted which
may be related to muscular spasm.

Skull base and vertebrae: 7 cervical segments are well visualized.
Vertebral body height is well maintained. Facet hypertrophic changes
are noted at multiple levels as are osteophytic changes. No acute
fracture or acute facet abnormality is noted.

Soft tissues and spinal canal: Surrounding soft tissues are within
normal limits.

Upper chest: No acute abnormality is noted in the upper chest.

Other: None
IMPRESSION: CT of the head: Chronic atrophic and ischemic changes without acute
abnormality.

CT of the cervical spine: Multilevel degenerative change without
acute abnormality.

## 2019-02-01 DIAGNOSIS — R633 Feeding difficulties: Secondary | ICD-10-CM | POA: Diagnosis not present

## 2019-02-01 DIAGNOSIS — M6281 Muscle weakness (generalized): Secondary | ICD-10-CM | POA: Diagnosis not present

## 2019-02-01 DIAGNOSIS — R262 Difficulty in walking, not elsewhere classified: Secondary | ICD-10-CM | POA: Diagnosis not present

## 2019-02-01 DIAGNOSIS — R488 Other symbolic dysfunctions: Secondary | ICD-10-CM | POA: Diagnosis not present

## 2019-02-04 DIAGNOSIS — R262 Difficulty in walking, not elsewhere classified: Secondary | ICD-10-CM | POA: Diagnosis not present

## 2019-02-04 DIAGNOSIS — R633 Feeding difficulties: Secondary | ICD-10-CM | POA: Diagnosis not present

## 2019-02-04 DIAGNOSIS — R488 Other symbolic dysfunctions: Secondary | ICD-10-CM | POA: Diagnosis not present

## 2019-02-04 DIAGNOSIS — M6281 Muscle weakness (generalized): Secondary | ICD-10-CM | POA: Diagnosis not present

## 2019-02-05 DIAGNOSIS — R262 Difficulty in walking, not elsewhere classified: Secondary | ICD-10-CM | POA: Diagnosis not present

## 2019-02-05 DIAGNOSIS — M6281 Muscle weakness (generalized): Secondary | ICD-10-CM | POA: Diagnosis not present

## 2019-02-05 DIAGNOSIS — R488 Other symbolic dysfunctions: Secondary | ICD-10-CM | POA: Diagnosis not present

## 2019-02-05 DIAGNOSIS — R633 Feeding difficulties: Secondary | ICD-10-CM | POA: Diagnosis not present

## 2019-02-06 DIAGNOSIS — R633 Feeding difficulties: Secondary | ICD-10-CM | POA: Diagnosis not present

## 2019-02-06 DIAGNOSIS — M6281 Muscle weakness (generalized): Secondary | ICD-10-CM | POA: Diagnosis not present

## 2019-02-06 DIAGNOSIS — R262 Difficulty in walking, not elsewhere classified: Secondary | ICD-10-CM | POA: Diagnosis not present

## 2019-02-06 DIAGNOSIS — R488 Other symbolic dysfunctions: Secondary | ICD-10-CM | POA: Diagnosis not present

## 2019-02-07 DIAGNOSIS — R262 Difficulty in walking, not elsewhere classified: Secondary | ICD-10-CM | POA: Diagnosis not present

## 2019-02-07 DIAGNOSIS — M6281 Muscle weakness (generalized): Secondary | ICD-10-CM | POA: Diagnosis not present

## 2019-02-07 DIAGNOSIS — R488 Other symbolic dysfunctions: Secondary | ICD-10-CM | POA: Diagnosis not present

## 2019-02-07 DIAGNOSIS — R633 Feeding difficulties: Secondary | ICD-10-CM | POA: Diagnosis not present

## 2019-02-11 DIAGNOSIS — R262 Difficulty in walking, not elsewhere classified: Secondary | ICD-10-CM | POA: Diagnosis not present

## 2019-02-11 DIAGNOSIS — R633 Feeding difficulties: Secondary | ICD-10-CM | POA: Diagnosis not present

## 2019-02-11 DIAGNOSIS — M6281 Muscle weakness (generalized): Secondary | ICD-10-CM | POA: Diagnosis not present

## 2019-02-11 DIAGNOSIS — R488 Other symbolic dysfunctions: Secondary | ICD-10-CM | POA: Diagnosis not present

## 2019-02-12 DIAGNOSIS — R633 Feeding difficulties: Secondary | ICD-10-CM | POA: Diagnosis not present

## 2019-02-12 DIAGNOSIS — R262 Difficulty in walking, not elsewhere classified: Secondary | ICD-10-CM | POA: Diagnosis not present

## 2019-02-12 DIAGNOSIS — R488 Other symbolic dysfunctions: Secondary | ICD-10-CM | POA: Diagnosis not present

## 2019-02-12 DIAGNOSIS — M6281 Muscle weakness (generalized): Secondary | ICD-10-CM | POA: Diagnosis not present

## 2019-02-18 DIAGNOSIS — M6281 Muscle weakness (generalized): Secondary | ICD-10-CM | POA: Diagnosis not present

## 2019-02-18 DIAGNOSIS — R488 Other symbolic dysfunctions: Secondary | ICD-10-CM | POA: Diagnosis not present

## 2019-02-18 DIAGNOSIS — R262 Difficulty in walking, not elsewhere classified: Secondary | ICD-10-CM | POA: Diagnosis not present

## 2019-02-18 DIAGNOSIS — R633 Feeding difficulties: Secondary | ICD-10-CM | POA: Diagnosis not present

## 2019-02-19 DIAGNOSIS — R633 Feeding difficulties: Secondary | ICD-10-CM | POA: Diagnosis not present

## 2019-02-19 DIAGNOSIS — M6281 Muscle weakness (generalized): Secondary | ICD-10-CM | POA: Diagnosis not present

## 2019-02-19 DIAGNOSIS — R488 Other symbolic dysfunctions: Secondary | ICD-10-CM | POA: Diagnosis not present

## 2019-02-19 DIAGNOSIS — R262 Difficulty in walking, not elsewhere classified: Secondary | ICD-10-CM | POA: Diagnosis not present

## 2019-02-28 DIAGNOSIS — R633 Feeding difficulties: Secondary | ICD-10-CM | POA: Diagnosis not present

## 2019-02-28 DIAGNOSIS — R262 Difficulty in walking, not elsewhere classified: Secondary | ICD-10-CM | POA: Diagnosis not present

## 2019-02-28 DIAGNOSIS — M6281 Muscle weakness (generalized): Secondary | ICD-10-CM | POA: Diagnosis not present

## 2019-02-28 DIAGNOSIS — R488 Other symbolic dysfunctions: Secondary | ICD-10-CM | POA: Diagnosis not present

## 2019-03-01 DIAGNOSIS — R262 Difficulty in walking, not elsewhere classified: Secondary | ICD-10-CM | POA: Diagnosis not present

## 2019-03-01 DIAGNOSIS — R633 Feeding difficulties: Secondary | ICD-10-CM | POA: Diagnosis not present

## 2019-03-01 DIAGNOSIS — R488 Other symbolic dysfunctions: Secondary | ICD-10-CM | POA: Diagnosis not present

## 2019-03-01 DIAGNOSIS — M6281 Muscle weakness (generalized): Secondary | ICD-10-CM | POA: Diagnosis not present

## 2019-03-04 DIAGNOSIS — R633 Feeding difficulties: Secondary | ICD-10-CM | POA: Diagnosis not present

## 2019-03-04 DIAGNOSIS — R262 Difficulty in walking, not elsewhere classified: Secondary | ICD-10-CM | POA: Diagnosis not present

## 2019-03-04 DIAGNOSIS — R488 Other symbolic dysfunctions: Secondary | ICD-10-CM | POA: Diagnosis not present

## 2019-03-04 DIAGNOSIS — M6281 Muscle weakness (generalized): Secondary | ICD-10-CM | POA: Diagnosis not present

## 2019-03-05 DIAGNOSIS — R262 Difficulty in walking, not elsewhere classified: Secondary | ICD-10-CM | POA: Diagnosis not present

## 2019-03-05 DIAGNOSIS — M6281 Muscle weakness (generalized): Secondary | ICD-10-CM | POA: Diagnosis not present

## 2019-03-05 DIAGNOSIS — R633 Feeding difficulties: Secondary | ICD-10-CM | POA: Diagnosis not present

## 2019-03-05 DIAGNOSIS — R488 Other symbolic dysfunctions: Secondary | ICD-10-CM | POA: Diagnosis not present

## 2019-03-06 DIAGNOSIS — R262 Difficulty in walking, not elsewhere classified: Secondary | ICD-10-CM | POA: Diagnosis not present

## 2019-03-06 DIAGNOSIS — R488 Other symbolic dysfunctions: Secondary | ICD-10-CM | POA: Diagnosis not present

## 2019-03-06 DIAGNOSIS — M6281 Muscle weakness (generalized): Secondary | ICD-10-CM | POA: Diagnosis not present

## 2019-03-06 DIAGNOSIS — R633 Feeding difficulties: Secondary | ICD-10-CM | POA: Diagnosis not present

## 2019-03-07 DIAGNOSIS — R633 Feeding difficulties: Secondary | ICD-10-CM | POA: Diagnosis not present

## 2019-03-07 DIAGNOSIS — R488 Other symbolic dysfunctions: Secondary | ICD-10-CM | POA: Diagnosis not present

## 2019-03-07 DIAGNOSIS — R262 Difficulty in walking, not elsewhere classified: Secondary | ICD-10-CM | POA: Diagnosis not present

## 2019-03-07 DIAGNOSIS — M6281 Muscle weakness (generalized): Secondary | ICD-10-CM | POA: Diagnosis not present

## 2019-03-12 DIAGNOSIS — R633 Feeding difficulties: Secondary | ICD-10-CM | POA: Diagnosis not present

## 2019-03-12 DIAGNOSIS — R488 Other symbolic dysfunctions: Secondary | ICD-10-CM | POA: Diagnosis not present

## 2019-03-12 DIAGNOSIS — M6281 Muscle weakness (generalized): Secondary | ICD-10-CM | POA: Diagnosis not present

## 2019-03-12 DIAGNOSIS — R262 Difficulty in walking, not elsewhere classified: Secondary | ICD-10-CM | POA: Diagnosis not present

## 2019-03-13 DIAGNOSIS — M6281 Muscle weakness (generalized): Secondary | ICD-10-CM | POA: Diagnosis not present

## 2019-03-13 DIAGNOSIS — R488 Other symbolic dysfunctions: Secondary | ICD-10-CM | POA: Diagnosis not present

## 2019-03-13 DIAGNOSIS — R633 Feeding difficulties: Secondary | ICD-10-CM | POA: Diagnosis not present

## 2019-03-13 DIAGNOSIS — R262 Difficulty in walking, not elsewhere classified: Secondary | ICD-10-CM | POA: Diagnosis not present

## 2019-03-14 DIAGNOSIS — R262 Difficulty in walking, not elsewhere classified: Secondary | ICD-10-CM | POA: Diagnosis not present

## 2019-03-14 DIAGNOSIS — M6281 Muscle weakness (generalized): Secondary | ICD-10-CM | POA: Diagnosis not present

## 2019-03-14 DIAGNOSIS — R633 Feeding difficulties: Secondary | ICD-10-CM | POA: Diagnosis not present

## 2019-03-14 DIAGNOSIS — R488 Other symbolic dysfunctions: Secondary | ICD-10-CM | POA: Diagnosis not present

## 2019-03-15 DIAGNOSIS — R633 Feeding difficulties: Secondary | ICD-10-CM | POA: Diagnosis not present

## 2019-03-15 DIAGNOSIS — R262 Difficulty in walking, not elsewhere classified: Secondary | ICD-10-CM | POA: Diagnosis not present

## 2019-03-15 DIAGNOSIS — M6281 Muscle weakness (generalized): Secondary | ICD-10-CM | POA: Diagnosis not present

## 2019-03-15 DIAGNOSIS — R488 Other symbolic dysfunctions: Secondary | ICD-10-CM | POA: Diagnosis not present

## 2019-03-18 DIAGNOSIS — M6281 Muscle weakness (generalized): Secondary | ICD-10-CM | POA: Diagnosis not present

## 2019-03-18 DIAGNOSIS — R633 Feeding difficulties: Secondary | ICD-10-CM | POA: Diagnosis not present

## 2019-03-18 DIAGNOSIS — R262 Difficulty in walking, not elsewhere classified: Secondary | ICD-10-CM | POA: Diagnosis not present

## 2019-03-18 DIAGNOSIS — R488 Other symbolic dysfunctions: Secondary | ICD-10-CM | POA: Diagnosis not present

## 2019-03-19 DIAGNOSIS — R633 Feeding difficulties: Secondary | ICD-10-CM | POA: Diagnosis not present

## 2019-03-19 DIAGNOSIS — R488 Other symbolic dysfunctions: Secondary | ICD-10-CM | POA: Diagnosis not present

## 2019-03-19 DIAGNOSIS — M6281 Muscle weakness (generalized): Secondary | ICD-10-CM | POA: Diagnosis not present

## 2019-03-19 DIAGNOSIS — R262 Difficulty in walking, not elsewhere classified: Secondary | ICD-10-CM | POA: Diagnosis not present

## 2019-03-20 DIAGNOSIS — M6281 Muscle weakness (generalized): Secondary | ICD-10-CM | POA: Diagnosis not present

## 2019-03-20 DIAGNOSIS — R488 Other symbolic dysfunctions: Secondary | ICD-10-CM | POA: Diagnosis not present

## 2019-03-20 DIAGNOSIS — R633 Feeding difficulties: Secondary | ICD-10-CM | POA: Diagnosis not present

## 2019-03-20 DIAGNOSIS — R262 Difficulty in walking, not elsewhere classified: Secondary | ICD-10-CM | POA: Diagnosis not present

## 2019-03-21 DIAGNOSIS — R633 Feeding difficulties: Secondary | ICD-10-CM | POA: Diagnosis not present

## 2019-03-21 DIAGNOSIS — R488 Other symbolic dysfunctions: Secondary | ICD-10-CM | POA: Diagnosis not present

## 2019-03-21 DIAGNOSIS — R262 Difficulty in walking, not elsewhere classified: Secondary | ICD-10-CM | POA: Diagnosis not present

## 2019-03-21 DIAGNOSIS — M6281 Muscle weakness (generalized): Secondary | ICD-10-CM | POA: Diagnosis not present

## 2019-03-22 DIAGNOSIS — R633 Feeding difficulties: Secondary | ICD-10-CM | POA: Diagnosis not present

## 2019-03-22 DIAGNOSIS — M6281 Muscle weakness (generalized): Secondary | ICD-10-CM | POA: Diagnosis not present

## 2019-03-22 DIAGNOSIS — R262 Difficulty in walking, not elsewhere classified: Secondary | ICD-10-CM | POA: Diagnosis not present

## 2019-03-22 DIAGNOSIS — R488 Other symbolic dysfunctions: Secondary | ICD-10-CM | POA: Diagnosis not present

## 2019-03-25 DIAGNOSIS — R488 Other symbolic dysfunctions: Secondary | ICD-10-CM | POA: Diagnosis not present

## 2019-03-25 DIAGNOSIS — R633 Feeding difficulties: Secondary | ICD-10-CM | POA: Diagnosis not present

## 2019-03-25 DIAGNOSIS — M6281 Muscle weakness (generalized): Secondary | ICD-10-CM | POA: Diagnosis not present

## 2019-03-25 DIAGNOSIS — R262 Difficulty in walking, not elsewhere classified: Secondary | ICD-10-CM | POA: Diagnosis not present

## 2019-03-26 DIAGNOSIS — R488 Other symbolic dysfunctions: Secondary | ICD-10-CM | POA: Diagnosis not present

## 2019-03-26 DIAGNOSIS — R633 Feeding difficulties: Secondary | ICD-10-CM | POA: Diagnosis not present

## 2019-03-26 DIAGNOSIS — M6281 Muscle weakness (generalized): Secondary | ICD-10-CM | POA: Diagnosis not present

## 2019-03-26 DIAGNOSIS — R262 Difficulty in walking, not elsewhere classified: Secondary | ICD-10-CM | POA: Diagnosis not present

## 2019-03-27 DIAGNOSIS — M6281 Muscle weakness (generalized): Secondary | ICD-10-CM | POA: Diagnosis not present

## 2019-03-27 DIAGNOSIS — R488 Other symbolic dysfunctions: Secondary | ICD-10-CM | POA: Diagnosis not present

## 2019-03-27 DIAGNOSIS — R262 Difficulty in walking, not elsewhere classified: Secondary | ICD-10-CM | POA: Diagnosis not present

## 2019-03-27 DIAGNOSIS — R633 Feeding difficulties: Secondary | ICD-10-CM | POA: Diagnosis not present

## 2019-03-28 DIAGNOSIS — R633 Feeding difficulties: Secondary | ICD-10-CM | POA: Diagnosis not present

## 2019-03-28 DIAGNOSIS — R262 Difficulty in walking, not elsewhere classified: Secondary | ICD-10-CM | POA: Diagnosis not present

## 2019-03-28 DIAGNOSIS — R488 Other symbolic dysfunctions: Secondary | ICD-10-CM | POA: Diagnosis not present

## 2019-03-28 DIAGNOSIS — M6281 Muscle weakness (generalized): Secondary | ICD-10-CM | POA: Diagnosis not present

## 2019-03-29 DIAGNOSIS — M6281 Muscle weakness (generalized): Secondary | ICD-10-CM | POA: Diagnosis not present

## 2019-03-29 DIAGNOSIS — R262 Difficulty in walking, not elsewhere classified: Secondary | ICD-10-CM | POA: Diagnosis not present

## 2019-03-29 DIAGNOSIS — R488 Other symbolic dysfunctions: Secondary | ICD-10-CM | POA: Diagnosis not present

## 2019-03-29 DIAGNOSIS — R633 Feeding difficulties: Secondary | ICD-10-CM | POA: Diagnosis not present

## 2019-04-01 DIAGNOSIS — M6281 Muscle weakness (generalized): Secondary | ICD-10-CM | POA: Diagnosis not present

## 2019-04-01 DIAGNOSIS — R633 Feeding difficulties: Secondary | ICD-10-CM | POA: Diagnosis not present

## 2019-04-01 DIAGNOSIS — R262 Difficulty in walking, not elsewhere classified: Secondary | ICD-10-CM | POA: Diagnosis not present

## 2019-04-01 DIAGNOSIS — R488 Other symbolic dysfunctions: Secondary | ICD-10-CM | POA: Diagnosis not present

## 2019-04-02 DIAGNOSIS — R488 Other symbolic dysfunctions: Secondary | ICD-10-CM | POA: Diagnosis not present

## 2019-04-02 DIAGNOSIS — R633 Feeding difficulties: Secondary | ICD-10-CM | POA: Diagnosis not present

## 2019-04-02 DIAGNOSIS — M6281 Muscle weakness (generalized): Secondary | ICD-10-CM | POA: Diagnosis not present

## 2019-04-02 DIAGNOSIS — R262 Difficulty in walking, not elsewhere classified: Secondary | ICD-10-CM | POA: Diagnosis not present

## 2019-05-13 DIAGNOSIS — R488 Other symbolic dysfunctions: Secondary | ICD-10-CM | POA: Diagnosis not present

## 2019-05-13 DIAGNOSIS — R262 Difficulty in walking, not elsewhere classified: Secondary | ICD-10-CM | POA: Diagnosis not present

## 2019-05-13 DIAGNOSIS — M6281 Muscle weakness (generalized): Secondary | ICD-10-CM | POA: Diagnosis not present

## 2019-05-13 DIAGNOSIS — R633 Feeding difficulties: Secondary | ICD-10-CM | POA: Diagnosis not present

## 2019-05-14 DIAGNOSIS — R262 Difficulty in walking, not elsewhere classified: Secondary | ICD-10-CM | POA: Diagnosis not present

## 2019-05-14 DIAGNOSIS — R633 Feeding difficulties: Secondary | ICD-10-CM | POA: Diagnosis not present

## 2019-05-14 DIAGNOSIS — M6281 Muscle weakness (generalized): Secondary | ICD-10-CM | POA: Diagnosis not present

## 2019-05-14 DIAGNOSIS — R488 Other symbolic dysfunctions: Secondary | ICD-10-CM | POA: Diagnosis not present

## 2019-05-15 DIAGNOSIS — R633 Feeding difficulties: Secondary | ICD-10-CM | POA: Diagnosis not present

## 2019-05-15 DIAGNOSIS — R262 Difficulty in walking, not elsewhere classified: Secondary | ICD-10-CM | POA: Diagnosis not present

## 2019-05-15 DIAGNOSIS — M6281 Muscle weakness (generalized): Secondary | ICD-10-CM | POA: Diagnosis not present

## 2019-05-15 DIAGNOSIS — R488 Other symbolic dysfunctions: Secondary | ICD-10-CM | POA: Diagnosis not present

## 2019-05-16 DIAGNOSIS — R262 Difficulty in walking, not elsewhere classified: Secondary | ICD-10-CM | POA: Diagnosis not present

## 2019-05-16 DIAGNOSIS — R633 Feeding difficulties: Secondary | ICD-10-CM | POA: Diagnosis not present

## 2019-05-16 DIAGNOSIS — R488 Other symbolic dysfunctions: Secondary | ICD-10-CM | POA: Diagnosis not present

## 2019-05-16 DIAGNOSIS — M6281 Muscle weakness (generalized): Secondary | ICD-10-CM | POA: Diagnosis not present

## 2019-05-21 DIAGNOSIS — M6281 Muscle weakness (generalized): Secondary | ICD-10-CM | POA: Diagnosis not present

## 2019-05-21 DIAGNOSIS — R488 Other symbolic dysfunctions: Secondary | ICD-10-CM | POA: Diagnosis not present

## 2019-05-21 DIAGNOSIS — R633 Feeding difficulties: Secondary | ICD-10-CM | POA: Diagnosis not present

## 2019-05-21 DIAGNOSIS — R262 Difficulty in walking, not elsewhere classified: Secondary | ICD-10-CM | POA: Diagnosis not present

## 2019-05-22 DIAGNOSIS — M6281 Muscle weakness (generalized): Secondary | ICD-10-CM | POA: Diagnosis not present

## 2019-05-22 DIAGNOSIS — R488 Other symbolic dysfunctions: Secondary | ICD-10-CM | POA: Diagnosis not present

## 2019-05-22 DIAGNOSIS — R633 Feeding difficulties: Secondary | ICD-10-CM | POA: Diagnosis not present

## 2019-05-22 DIAGNOSIS — R262 Difficulty in walking, not elsewhere classified: Secondary | ICD-10-CM | POA: Diagnosis not present

## 2019-05-24 DIAGNOSIS — R262 Difficulty in walking, not elsewhere classified: Secondary | ICD-10-CM | POA: Diagnosis not present

## 2019-05-24 DIAGNOSIS — M6281 Muscle weakness (generalized): Secondary | ICD-10-CM | POA: Diagnosis not present

## 2019-05-24 DIAGNOSIS — R488 Other symbolic dysfunctions: Secondary | ICD-10-CM | POA: Diagnosis not present

## 2019-05-24 DIAGNOSIS — R633 Feeding difficulties: Secondary | ICD-10-CM | POA: Diagnosis not present

## 2019-05-28 DIAGNOSIS — R488 Other symbolic dysfunctions: Secondary | ICD-10-CM | POA: Diagnosis not present

## 2019-05-28 DIAGNOSIS — R633 Feeding difficulties: Secondary | ICD-10-CM | POA: Diagnosis not present

## 2019-05-28 DIAGNOSIS — R262 Difficulty in walking, not elsewhere classified: Secondary | ICD-10-CM | POA: Diagnosis not present

## 2019-05-28 DIAGNOSIS — M6281 Muscle weakness (generalized): Secondary | ICD-10-CM | POA: Diagnosis not present

## 2019-05-29 DIAGNOSIS — M6281 Muscle weakness (generalized): Secondary | ICD-10-CM | POA: Diagnosis not present

## 2019-05-29 DIAGNOSIS — R488 Other symbolic dysfunctions: Secondary | ICD-10-CM | POA: Diagnosis not present

## 2019-05-29 DIAGNOSIS — R262 Difficulty in walking, not elsewhere classified: Secondary | ICD-10-CM | POA: Diagnosis not present

## 2019-05-29 DIAGNOSIS — R633 Feeding difficulties: Secondary | ICD-10-CM | POA: Diagnosis not present

## 2019-05-30 DIAGNOSIS — R262 Difficulty in walking, not elsewhere classified: Secondary | ICD-10-CM | POA: Diagnosis not present

## 2019-05-30 DIAGNOSIS — R633 Feeding difficulties: Secondary | ICD-10-CM | POA: Diagnosis not present

## 2019-05-30 DIAGNOSIS — M6281 Muscle weakness (generalized): Secondary | ICD-10-CM | POA: Diagnosis not present

## 2019-05-30 DIAGNOSIS — R488 Other symbolic dysfunctions: Secondary | ICD-10-CM | POA: Diagnosis not present

## 2019-05-31 DIAGNOSIS — M6281 Muscle weakness (generalized): Secondary | ICD-10-CM | POA: Diagnosis not present

## 2019-05-31 DIAGNOSIS — R488 Other symbolic dysfunctions: Secondary | ICD-10-CM | POA: Diagnosis not present

## 2019-05-31 DIAGNOSIS — R633 Feeding difficulties: Secondary | ICD-10-CM | POA: Diagnosis not present

## 2019-05-31 DIAGNOSIS — R262 Difficulty in walking, not elsewhere classified: Secondary | ICD-10-CM | POA: Diagnosis not present

## 2019-06-04 DIAGNOSIS — R488 Other symbolic dysfunctions: Secondary | ICD-10-CM | POA: Diagnosis not present

## 2019-06-04 DIAGNOSIS — R633 Feeding difficulties: Secondary | ICD-10-CM | POA: Diagnosis not present

## 2019-06-04 DIAGNOSIS — M6281 Muscle weakness (generalized): Secondary | ICD-10-CM | POA: Diagnosis not present

## 2019-06-04 DIAGNOSIS — R262 Difficulty in walking, not elsewhere classified: Secondary | ICD-10-CM | POA: Diagnosis not present

## 2019-06-05 DIAGNOSIS — M6281 Muscle weakness (generalized): Secondary | ICD-10-CM | POA: Diagnosis not present

## 2019-06-05 DIAGNOSIS — R262 Difficulty in walking, not elsewhere classified: Secondary | ICD-10-CM | POA: Diagnosis not present

## 2019-06-05 DIAGNOSIS — R488 Other symbolic dysfunctions: Secondary | ICD-10-CM | POA: Diagnosis not present

## 2019-06-05 DIAGNOSIS — R633 Feeding difficulties: Secondary | ICD-10-CM | POA: Diagnosis not present

## 2019-06-07 DIAGNOSIS — R262 Difficulty in walking, not elsewhere classified: Secondary | ICD-10-CM | POA: Diagnosis not present

## 2019-06-07 DIAGNOSIS — M6281 Muscle weakness (generalized): Secondary | ICD-10-CM | POA: Diagnosis not present

## 2019-06-07 DIAGNOSIS — R633 Feeding difficulties: Secondary | ICD-10-CM | POA: Diagnosis not present

## 2019-06-07 DIAGNOSIS — R488 Other symbolic dysfunctions: Secondary | ICD-10-CM | POA: Diagnosis not present

## 2019-06-11 DIAGNOSIS — R633 Feeding difficulties: Secondary | ICD-10-CM | POA: Diagnosis not present

## 2019-06-11 DIAGNOSIS — R488 Other symbolic dysfunctions: Secondary | ICD-10-CM | POA: Diagnosis not present

## 2019-06-11 DIAGNOSIS — R262 Difficulty in walking, not elsewhere classified: Secondary | ICD-10-CM | POA: Diagnosis not present

## 2019-06-11 DIAGNOSIS — M6281 Muscle weakness (generalized): Secondary | ICD-10-CM | POA: Diagnosis not present

## 2019-06-13 DIAGNOSIS — R262 Difficulty in walking, not elsewhere classified: Secondary | ICD-10-CM | POA: Diagnosis not present

## 2019-06-13 DIAGNOSIS — R633 Feeding difficulties: Secondary | ICD-10-CM | POA: Diagnosis not present

## 2019-06-13 DIAGNOSIS — M6281 Muscle weakness (generalized): Secondary | ICD-10-CM | POA: Diagnosis not present

## 2019-06-13 DIAGNOSIS — R488 Other symbolic dysfunctions: Secondary | ICD-10-CM | POA: Diagnosis not present

## 2019-06-14 DIAGNOSIS — R262 Difficulty in walking, not elsewhere classified: Secondary | ICD-10-CM | POA: Diagnosis not present

## 2019-06-14 DIAGNOSIS — R488 Other symbolic dysfunctions: Secondary | ICD-10-CM | POA: Diagnosis not present

## 2019-06-14 DIAGNOSIS — M6281 Muscle weakness (generalized): Secondary | ICD-10-CM | POA: Diagnosis not present

## 2019-06-14 DIAGNOSIS — R633 Feeding difficulties: Secondary | ICD-10-CM | POA: Diagnosis not present

## 2019-06-17 DIAGNOSIS — M6281 Muscle weakness (generalized): Secondary | ICD-10-CM | POA: Diagnosis not present

## 2019-06-17 DIAGNOSIS — R262 Difficulty in walking, not elsewhere classified: Secondary | ICD-10-CM | POA: Diagnosis not present

## 2019-06-19 DIAGNOSIS — R262 Difficulty in walking, not elsewhere classified: Secondary | ICD-10-CM | POA: Diagnosis not present

## 2019-06-19 DIAGNOSIS — M6281 Muscle weakness (generalized): Secondary | ICD-10-CM | POA: Diagnosis not present

## 2019-06-21 DIAGNOSIS — R262 Difficulty in walking, not elsewhere classified: Secondary | ICD-10-CM | POA: Diagnosis not present

## 2019-06-21 DIAGNOSIS — M6281 Muscle weakness (generalized): Secondary | ICD-10-CM | POA: Diagnosis not present

## 2019-06-24 DIAGNOSIS — R262 Difficulty in walking, not elsewhere classified: Secondary | ICD-10-CM | POA: Diagnosis not present

## 2019-06-24 DIAGNOSIS — M6281 Muscle weakness (generalized): Secondary | ICD-10-CM | POA: Diagnosis not present

## 2019-08-06 DIAGNOSIS — R269 Unspecified abnormalities of gait and mobility: Secondary | ICD-10-CM | POA: Diagnosis not present

## 2019-08-06 DIAGNOSIS — G301 Alzheimer's disease with late onset: Secondary | ICD-10-CM | POA: Diagnosis not present

## 2019-08-16 DIAGNOSIS — R1312 Dysphagia, oropharyngeal phase: Secondary | ICD-10-CM | POA: Diagnosis not present

## 2019-08-16 DIAGNOSIS — F0280 Dementia in other diseases classified elsewhere without behavioral disturbance: Secondary | ICD-10-CM | POA: Diagnosis not present

## 2019-08-22 DIAGNOSIS — F0280 Dementia in other diseases classified elsewhere without behavioral disturbance: Secondary | ICD-10-CM | POA: Diagnosis not present

## 2019-08-22 DIAGNOSIS — R1312 Dysphagia, oropharyngeal phase: Secondary | ICD-10-CM | POA: Diagnosis not present

## 2019-08-28 DIAGNOSIS — F0280 Dementia in other diseases classified elsewhere without behavioral disturbance: Secondary | ICD-10-CM | POA: Diagnosis not present

## 2019-08-28 DIAGNOSIS — R1312 Dysphagia, oropharyngeal phase: Secondary | ICD-10-CM | POA: Diagnosis not present

## 2019-09-02 DIAGNOSIS — F0280 Dementia in other diseases classified elsewhere without behavioral disturbance: Secondary | ICD-10-CM | POA: Diagnosis not present

## 2019-09-02 DIAGNOSIS — R1312 Dysphagia, oropharyngeal phase: Secondary | ICD-10-CM | POA: Diagnosis not present

## 2019-09-04 DIAGNOSIS — F0280 Dementia in other diseases classified elsewhere without behavioral disturbance: Secondary | ICD-10-CM | POA: Diagnosis not present

## 2019-09-04 DIAGNOSIS — R1312 Dysphagia, oropharyngeal phase: Secondary | ICD-10-CM | POA: Diagnosis not present

## 2019-09-06 DIAGNOSIS — R1312 Dysphagia, oropharyngeal phase: Secondary | ICD-10-CM | POA: Diagnosis not present

## 2019-09-06 DIAGNOSIS — F0280 Dementia in other diseases classified elsewhere without behavioral disturbance: Secondary | ICD-10-CM | POA: Diagnosis not present

## 2019-09-09 DIAGNOSIS — F0280 Dementia in other diseases classified elsewhere without behavioral disturbance: Secondary | ICD-10-CM | POA: Diagnosis not present

## 2019-09-09 DIAGNOSIS — R1312 Dysphagia, oropharyngeal phase: Secondary | ICD-10-CM | POA: Diagnosis not present

## 2019-09-12 DIAGNOSIS — R1312 Dysphagia, oropharyngeal phase: Secondary | ICD-10-CM | POA: Diagnosis not present

## 2019-09-12 DIAGNOSIS — F0280 Dementia in other diseases classified elsewhere without behavioral disturbance: Secondary | ICD-10-CM | POA: Diagnosis not present

## 2019-09-17 DIAGNOSIS — F0280 Dementia in other diseases classified elsewhere without behavioral disturbance: Secondary | ICD-10-CM | POA: Diagnosis not present

## 2019-09-17 DIAGNOSIS — R1312 Dysphagia, oropharyngeal phase: Secondary | ICD-10-CM | POA: Diagnosis not present

## 2019-09-18 DIAGNOSIS — R1312 Dysphagia, oropharyngeal phase: Secondary | ICD-10-CM | POA: Diagnosis not present

## 2019-09-18 DIAGNOSIS — F0280 Dementia in other diseases classified elsewhere without behavioral disturbance: Secondary | ICD-10-CM | POA: Diagnosis not present

## 2019-09-23 DIAGNOSIS — B351 Tinea unguium: Secondary | ICD-10-CM | POA: Diagnosis not present

## 2019-09-24 DIAGNOSIS — R1312 Dysphagia, oropharyngeal phase: Secondary | ICD-10-CM | POA: Diagnosis not present

## 2019-09-24 DIAGNOSIS — F0280 Dementia in other diseases classified elsewhere without behavioral disturbance: Secondary | ICD-10-CM | POA: Diagnosis not present

## 2019-10-07 DIAGNOSIS — Z6823 Body mass index (BMI) 23.0-23.9, adult: Secondary | ICD-10-CM | POA: Diagnosis not present

## 2019-10-07 DIAGNOSIS — R159 Full incontinence of feces: Secondary | ICD-10-CM | POA: Diagnosis not present

## 2019-10-07 DIAGNOSIS — F418 Other specified anxiety disorders: Secondary | ICD-10-CM | POA: Diagnosis not present

## 2019-10-07 DIAGNOSIS — F0280 Dementia in other diseases classified elsewhere without behavioral disturbance: Secondary | ICD-10-CM | POA: Diagnosis not present

## 2019-10-07 DIAGNOSIS — E785 Hyperlipidemia, unspecified: Secondary | ICD-10-CM | POA: Diagnosis not present

## 2019-10-07 DIAGNOSIS — R32 Unspecified urinary incontinence: Secondary | ICD-10-CM | POA: Diagnosis not present

## 2019-10-07 DIAGNOSIS — G309 Alzheimer's disease, unspecified: Secondary | ICD-10-CM | POA: Diagnosis not present

## 2019-10-07 DIAGNOSIS — Z741 Need for assistance with personal care: Secondary | ICD-10-CM | POA: Diagnosis not present

## 2019-10-07 DIAGNOSIS — I10 Essential (primary) hypertension: Secondary | ICD-10-CM | POA: Diagnosis not present

## 2019-10-07 DIAGNOSIS — J302 Other seasonal allergic rhinitis: Secondary | ICD-10-CM | POA: Diagnosis not present

## 2019-10-15 DEATH — deceased
